# Patient Record
Sex: Female | Born: 1985
Health system: Southern US, Community
[De-identification: ages and names within clinical notes are randomized; demographics above are authoritative.]

## PROBLEM LIST (undated history)

## (undated) DIAGNOSIS — E88819 Insulin resistance, unspecified: Secondary | ICD-10-CM

## (undated) DIAGNOSIS — E669 Obesity, unspecified: Secondary | ICD-10-CM

## (undated) DIAGNOSIS — N979 Female infertility, unspecified: Secondary | ICD-10-CM

## (undated) DIAGNOSIS — D649 Anemia, unspecified: Secondary | ICD-10-CM

## (undated) DIAGNOSIS — R87610 Atypical squamous cells of undetermined significance on cytologic smear of cervix (ASC-US): Secondary | ICD-10-CM

## (undated) DIAGNOSIS — E8881 Metabolic syndrome: Secondary | ICD-10-CM

## (undated) DIAGNOSIS — R87612 Low grade squamous intraepithelial lesion on cytologic smear of cervix (LGSIL): Secondary | ICD-10-CM

## (undated) HISTORY — DX: Metabolic syndrome: E88.81

## (undated) HISTORY — DX: Insulin resistance, unspecified: E88.819

## (undated) HISTORY — DX: Low grade squamous intraepithelial lesion on cytologic smear of cervix (LGSIL): R87.612

## (undated) HISTORY — DX: Atypical squamous cells of undetermined significance on cytologic smear of cervix (ASC-US): R87.610

## (undated) HISTORY — DX: Female infertility, unspecified: N97.9

## (undated) HISTORY — DX: Obesity, unspecified: E66.9

---

## 2008-01-27 HISTORY — PX: COLPOSCOPY: SHX161

## 2008-08-16 DIAGNOSIS — R87612 Low grade squamous intraepithelial lesion on cytologic smear of cervix (LGSIL): Secondary | ICD-10-CM

## 2008-08-16 HISTORY — DX: Low grade squamous intraepithelial lesion on cytologic smear of cervix (LGSIL): R87.612

## 2008-09-27 ENCOUNTER — Ambulatory Visit: Payer: Self-pay

## 2008-10-04 ENCOUNTER — Ambulatory Visit: Payer: Self-pay

## 2008-10-04 HISTORY — PX: DILATION AND CURETTAGE OF UTERUS: SHX78

## 2008-10-04 HISTORY — PX: LEEP: SHX91

## 2011-07-25 ENCOUNTER — Inpatient Hospital Stay: Payer: Self-pay

## 2011-07-25 LAB — CBC WITH DIFFERENTIAL/PLATELET
Basophil #: 0 10*3/uL (ref 0.0–0.1)
Basophil %: 0.4 %
HCT: 28.4 % — ABNORMAL LOW (ref 35.0–47.0)
Lymphocyte %: 25.2 %
MCH: 29.6 pg (ref 26.0–34.0)
MCV: 92 fL (ref 80–100)
Monocyte #: 0.7 x10 3/mm (ref 0.2–0.9)
Neutrophil #: 6.4 10*3/uL (ref 1.4–6.5)
RDW: 15.9 % — ABNORMAL HIGH (ref 11.5–14.5)

## 2011-07-27 LAB — HEMATOCRIT: HCT: 24.8 % — ABNORMAL LOW (ref 35.0–47.0)

## 2011-10-26 ENCOUNTER — Emergency Department: Payer: Self-pay | Admitting: Emergency Medicine

## 2011-10-26 LAB — URINALYSIS, COMPLETE
Bilirubin,UR: NEGATIVE
Blood: NEGATIVE
Glucose,UR: NEGATIVE mg/dL
Leukocyte Esterase: NEGATIVE
Nitrite: NEGATIVE
Ph: 5
Protein: 30
RBC,UR: 3 /HPF
Specific Gravity: 1.031
Squamous Epithelial: 19
WBC UR: 2 /HPF

## 2011-10-26 LAB — COMPREHENSIVE METABOLIC PANEL
Albumin: 3.5 g/dL (ref 3.4–5.0)
Alkaline Phosphatase: 92 U/L (ref 50–136)
BUN: 15 mg/dL (ref 7–18)
Calcium, Total: 9.2 mg/dL (ref 8.5–10.1)
Chloride: 107 mmol/L (ref 98–107)
Co2: 26 mmol/L (ref 21–32)
Creatinine: 1.32 mg/dL — ABNORMAL HIGH (ref 0.60–1.30)
EGFR (African American): >60
EGFR (Non-African Amer.): 56 — ABNORMAL LOW
Glucose: 119 mg/dL — ABNORMAL HIGH (ref 65–99)
Osmolality: 287 (ref 275–301)
Potassium: 4 mmol/L (ref 3.5–5.1)
SGPT (ALT): 43 U/L (ref 12–78)

## 2011-10-26 LAB — CBC
HCT: 33.4 % — ABNORMAL LOW
HGB: 11.6 g/dL — ABNORMAL LOW
MCH: 29.1 pg
MCHC: 34.7 g/dL
MCV: 84 fL
Platelet: 225 10*3/uL
RBC: 3.99 X10 6/mm 3
RDW: 15.1 % — ABNORMAL HIGH
WBC: 10.4 10*3/uL

## 2011-10-26 LAB — LIPASE, BLOOD: Lipase: 117 U/L

## 2011-11-09 ENCOUNTER — Ambulatory Visit: Payer: Self-pay | Admitting: Surgery

## 2011-11-18 ENCOUNTER — Ambulatory Visit: Payer: Self-pay | Admitting: Surgery

## 2011-11-18 HISTORY — PX: CHOLECYSTECTOMY, LAPAROSCOPIC: SHX56

## 2011-11-18 LAB — CBC WITH DIFFERENTIAL/PLATELET
Basophil %: 0.2 %
Eosinophil #: 0.1 10*3/uL (ref 0.0–0.7)
Eosinophil %: 1 %
Lymphocyte #: 2.2 10*3/uL (ref 1.0–3.6)
Lymphocyte %: 32 %
Monocyte #: 0.5 x10 3/mm (ref 0.2–0.9)
Neutrophil #: 4 10*3/uL (ref 1.4–6.5)
Neutrophil %: 59.5 %
WBC: 6.8 10*3/uL (ref 3.6–11.0)

## 2011-11-18 LAB — HEPATIC FUNCTION PANEL A (ARMC)
Albumin: 3.4 g/dL (ref 3.4–5.0)
Alkaline Phosphatase: 110 U/L (ref 50–136)
Bilirubin, Direct: <0.1 mg/dL (ref 0.00–0.20)
Bilirubin,Total: 0.1 mg/dL — ABNORMAL LOW (ref 0.2–1.0)
Total Protein: 7.2 g/dL (ref 6.4–8.2)

## 2012-01-06 DIAGNOSIS — R87610 Atypical squamous cells of undetermined significance on cytologic smear of cervix (ASC-US): Secondary | ICD-10-CM

## 2012-01-06 HISTORY — DX: Atypical squamous cells of undetermined significance on cytologic smear of cervix (ASC-US): R87.610

## 2014-01-26 NOTE — L&D Delivery Note (Signed)
Delivery Note At 9:32 AM a viable female was delivered via Vaginal, Spontaneous Delivery (Presentation: Right Occiput Transverse).  APGAR: 8, 9; weight  .   Placenta status: Intact, Spontaneous.  Cord: 3 vessels with the following complications: .  Cord pH: N/A  Anesthesia: None  Episiotomy: None Lacerations: 1st degree Suture Repair: 2.0 vicryl Est. Blood Loss (mL): 400  Mom to postpartum.  Baby to Couplet care / Skin to Skin.  Quick second stage. Infant to maternal abdomen for delayed cord clamping, cut by FOB after pulsation stopped. Infant to skin-to-skin with mother. Bleeding initially brisk, resolved with Pitocin bolus only.   Baby's Name: Andrea Jordan 10/13/2014, 10:10 AM

## 2014-02-05 LAB — OB RESULTS CONSOLE VARICELLA ZOSTER ANTIBODY, IGG: Varicella: IMMUNE

## 2014-02-05 LAB — OB RESULTS CONSOLE RUBELLA ANTIBODY, IGM: RUBELLA: NON-IMMUNE/NOT IMMUNE

## 2014-02-05 LAB — OB RESULTS CONSOLE HEPATITIS B SURFACE ANTIGEN: Hepatitis B Surface Ag: NEGATIVE

## 2014-02-05 LAB — OB RESULTS CONSOLE ABO/RH: RH Type: POSITIVE

## 2014-02-05 LAB — OB RESULTS CONSOLE GC/CHLAMYDIA
Chlamydia: NEGATIVE
GC PROBE AMP, GENITAL: NEGATIVE

## 2014-02-05 LAB — OB RESULTS CONSOLE HIV ANTIBODY (ROUTINE TESTING): HIV: NONREACTIVE

## 2014-05-02 ENCOUNTER — Ambulatory Visit
Admit: 2014-05-02 | Disposition: A | Discharge status: Home / Self Care | Payer: Self-pay | Attending: Obstetrics and Gynecology | Admitting: Obstetrics and Gynecology

## 2014-05-15 NOTE — Op Note (Signed)
PATIENT NAME:  Andrea Jordan, Andrea Jordan MR#:  [id number] DATE OF BIRTH:  [birth year]  DATE OF PROCEDURE:  11/18/2011  PREOPERATIVE DIAGNOSIS: Symptomatic cholelithiasis.   POSTOPERATIVE DIAGNOSIS: Symptomatic cholecystitis.   PROCEDURE PERFORMED: Laparoscopic cholecystectomy   SURGEON: Nolah Krenzer A. Marina Gravel, MD   ANESTHESIA: General endotracheal.   FINDINGS: Small stones.   DESCRIPTION OF PROCEDURE: With the patient in the supine position, general oral endotracheal anesthesia was induced. The patient's abdomen was widely prepped and draped with ChloraPrep solution. Time-out was observed.   A 12 mm blunt Hassan trocar was placed through an open technique through an infraumbilical skin incision. Stay sutures were passed through the fascia. Pneumoperitoneum was established. The patient was then positioned in reverse Trendelenburg and airplane right side up. A 5 mm Bladeless trocar was placed in the epigastric region. Two 5-mm first assistant ports were placed in the right subcostal margin. The gallbladder was identified in the right upper quadrant, grasped along the fundus and elevated towards the right shoulder. Lateral traction was placed in Hartmann's pouch. Hepatoduodenal ligament was then incised with blunt technique liberating cystic duct. Single cystic artery was identified as well. [facility] cholangiogram catheter clamp was then placed across the base of the gallbladder. An attempt at cholangiography was performed, however, I could not obtain an adequate seal. As such, this was abandoned. As the patient had a previously normal appearing MRCP scan, I did not persist with cholangiography. Cystic duct was small caliber. With a critical view of safety cholecystectomy being identified, the cystic duct was doubly clipped on the portal side, singly clipped on the gallbladder side, and divided. Immediately adjacent a cystic artery was identified and this was doubly clipped on the portal side, singly clipped on  the gallbladder side, and divided. The gallbladder was then retrieved off the gallbladder fossa utilizing hook cautery apparatus, placed into an EndoCatch device, and retrieved. The right upper quadrant was irrigated with a total of 500 mL of normal saline, aspirated dry, and there was no evidence of bowel injury or bile leak. Ports were thus removed under direct visualization. The infraumbilical fascial defect was closed utilizing a figure-of-eight vertically oriented #0 Vicryl suture. A total of 30 mL of 0.25% plain Marcaine was infiltrated along all skin and fascial incisions. 4-0 Vicryl subcuticular was applied to all skin edges followed by benzoin, Steri-Strips, and occlusive sterile dressings.   The patient was then subsequently extubated and taken to the recovery room in stable and satisfactory condition.   ____________________________ Jeannette How Marina Gravel, MD mab:drc D: 11/18/2011 17:58:04 ET T: 11/19/2011 08:49:10 ET JOB#: [id number]  cc: Elta Guadeloupe A. Marina Gravel, MD, <Dictator> Wonda Cheng. Laurey Morale, MD Hortencia Conradi MD ELECTRONICALLY SIGNED 11/23/2011 7:04

## 2014-06-05 NOTE — H&P (Signed)
L&D Evaluation:  History:   HPI 29 year old G1 P0 with EDC=08/10/2011 by 8wk 3day ultrasound presents at 47 5/7 weeks with c/o SROM clear fluid at 1800 tonight. Has had some mild contractions since then. No bleeding. Baby active. Prenatal care remarkable for conception on Clomid and metformin (insulin resistence/PCOS), Hx of LEEP for LGSIL, anemia, obesity (BMI=48), and positive GBS. LABS: O POS, Rubell NI, VI, GBS positive. One  hr GTT elevated, but 3 hr GTT WNL.    Presents with SROM at 1800    Patient's Medical History Obesity, PCOS, insulin resistence.    Patient's Surgical History D&C  LEEP  both on 10/04/2008    Medications Pre Natal Vitamins  Metformin 1000 mgm BID, ASA 81 mgm daily.    Allergies NKDA, clams    Social History none    Family History Sister with septal closure defect   ROS:   ROS see HPI   Exam:   Vital Signs stable  124/58, Temp=98.9    General no apparent distress    Mental Status clear    Chest clear    Heart normal sinus rhythm, no murmur/gallop/rubs    Abdomen gravid, non-tender    Estimated Fetal Weight 8#    Fetal Position cephalic on ultrasound    Edema trace edema LE    Reflexes 2+    Pelvic no external lesions, 1/100%/ per RN exam    Mebranes Ruptured, gross ROM. +Nitrazine    Description clear    FHT Baby very active-it looks like baseline 140s with prolonged accels. moderate variability    Ucx irregular    Skin dry   Impression:   Impression IUP at 37 5/7 weeks with SROM. +GBS   Plan:   Plan EFM/NST, monitor contractions and for cervical change, antibiotics for GBBS prophylaxis, IF not actively laboring by 12 midnight will augment labor.   Electronic Signatures: Karene Fry (CNM)  (Signed 29-Jun-13 22:51)  Authored: L&D Evaluation   Last Updated: 29-Jun-13 22:51 by Karene Fry (CNM)

## 2014-09-13 LAB — OB RESULTS CONSOLE GBS: STREP GROUP B AG: NEGATIVE

## 2014-10-12 ENCOUNTER — Inpatient Hospital Stay
Admission: EM | Admit: 2014-10-12 | Discharge: 2014-10-15 | DRG: 775 | Disposition: A | Discharge status: Home / Self Care | Payer: BLUE CROSS/BLUE SHIELD | Attending: Obstetrics & Gynecology | Admitting: Obstetrics & Gynecology

## 2014-10-12 DIAGNOSIS — Z9889 Other specified postprocedural states: Secondary | ICD-10-CM

## 2014-10-12 DIAGNOSIS — O4103X0 Oligohydramnios, third trimester, not applicable or unspecified: Secondary | ICD-10-CM | POA: Diagnosis present

## 2014-10-12 DIAGNOSIS — Z3A40 40 weeks gestation of pregnancy: Secondary | ICD-10-CM | POA: Diagnosis present

## 2014-10-12 DIAGNOSIS — O9902 Anemia complicating childbirth: Secondary | ICD-10-CM | POA: Diagnosis present

## 2014-10-12 DIAGNOSIS — Z6841 Body Mass Index (BMI) 40.0 and over, adult: Secondary | ICD-10-CM

## 2014-10-12 DIAGNOSIS — O99214 Obesity complicating childbirth: Secondary | ICD-10-CM | POA: Diagnosis present

## 2014-10-12 HISTORY — DX: Anemia, unspecified: D64.9

## 2014-10-12 LAB — TYPE AND SCREEN
ABO/RH(D): O POS
Antibody Screen: NEGATIVE

## 2014-10-12 LAB — CBC
HEMATOCRIT: 31 % — AB (ref 35.0–47.0)
Hemoglobin: 10.2 g/dL — ABNORMAL LOW (ref 12.0–16.0)
MCH: 29.7 pg (ref 26.0–34.0)
MCHC: 32.8 g/dL (ref 32.0–36.0)
MCV: 90.3 fL (ref 80.0–100.0)
Platelets: 173 10*3/uL (ref 150–440)
RBC: 3.44 MIL/uL — ABNORMAL LOW (ref 3.80–5.20)
RDW: 15.6 % — AB (ref 11.5–14.5)
WBC: 9 10*3/uL (ref 3.6–11.0)

## 2014-10-12 LAB — ABO/RH: ABO/RH(D): O POS

## 2014-10-12 MED ORDER — LACTATED RINGERS IV SOLN: 500.0000 mL | INTRAVENOUS | Status: DC | PRN

## 2014-10-12 MED ORDER — OXYTOCIN 40 UNITS IN LACTATED RINGERS INFUSION - SIMPLE MED
62.5000 mL/h | INTRAVENOUS | Status: DC
  Administered 2014-10-13: 999 mL/h via INTRAVENOUS
  Filled 2014-10-12: qty 1000

## 2014-10-12 MED ORDER — OXYTOCIN BOLUS FROM INFUSION: 500.0000 mL | INTRAVENOUS | Status: DC

## 2014-10-12 MED ORDER — LACTATED RINGERS IV SOLN
INTRAVENOUS | Status: DC
  Administered 2014-10-12 – 2014-10-13 (×2): via INTRAVENOUS

## 2014-10-12 MED ORDER — OXYTOCIN 40 UNITS IN LACTATED RINGERS INFUSION - SIMPLE MED
1.0000 m[IU]/min | INTRAVENOUS | Status: DC
  Administered 2014-10-12: 1 m[IU]/min via INTRAVENOUS

## 2014-10-12 MED ORDER — TERBUTALINE SULFATE 1 MG/ML IJ SOLN: 0.2500 mg | Freq: Once | INTRAMUSCULAR | Status: DC | PRN

## 2014-10-12 NOTE — H&P (Signed)
OB History & Physical   History of Present Illness:  Chief Complaint:   HPI:  Andrea Jordan is a 29 y.o. G2P1001 female at [redacted]w[redacted]d dated by LMP c/w 1st trimester [place] with EDC of 10/06/14. Presented to L&D with complaints of no fetal movement since this AM.  Upon presentation she began to feel movement again, and it normalized.  However, AFI was performed and there were no pockets of fluid that were without umbilical cord, and scant at that.  This was confirmed via color doppler on ultrasound.    +FM, no CTX, no LOF, no VB  Pregnancy Issues: 1. Morbid obesity: BMI 49 (on admission BMI is calculated as 54) 2. History of "tissue dystocia" and PPH 3. History of LGA infant (8#10oz @ 37w) 4. History of LEEP, before G1 5. Family history of septal defect, declined fetal echo 6. Rubella non-immune 7. Anemia   Maternal Medical History:  No past medical history on file.  No past surgical history on file.  Allergies  Allergen Reactions  . Clams [Shellfish Allergy] Hives    Prior to Admission medications   Medication Sig Start Date End Date Taking? Authorizing Jakub Debold  ferrous sulfate 325 (65 FE) MG tablet Take 325 mg by mouth daily with breakfast.   Yes Historical Cadarius Nevares, MD  Prenatal Vit-Fe Fumarate-FA (PRENATAL MULTIVITAMIN) TABS tablet Take 1 tablet by mouth daily at 12 noon.   Yes Historical Lucah Petta, MD     Prenatal care site: [facility]   Social History: 0x3     Family History: family history is not on file.   Review of Systems: Negative x 10 systems reviewed except as noted in the HPI.     Physical Exam:  Vital Signs: BP 113/64 mmHg  Pulse 72  Temp(Src) 98.1 F (36.7 C) (Oral)  Resp 19  Ht $R'5\' 3"'sg$  (1.6 m)  Wt 138.347 kg (305 lb)  BMI 54.04 kg/m2 General: no acute distress.  HEENT: normocephalic, atraumatic Heart: regular rate & rhythm.  No murmurs/rubs/gallops Lungs: clear to auscultation bilaterally, normal respiratory effort Abdomen: soft, gravid,  non-tender;  EFW: 8.5lbs Pelvic:   External: Normal external female genitalia  Cervix: Dilation: 3 / Effacement (%): 80 / Station: -2    Extremities: non-tender, symmetric, 1+ edema bilaterally.  DTRs: 1+ Neurologic: Alert & oriented x 3.    Pertinent Results:  Prenatal Labs: Blood type/Rh O+   Antibody screen neg  Rubella Varicella non-immune immune  RPR NR  HBsAg neg  HIV NR  GC neg  Chlamydia neg  Genetic screening declined  1 hour GTT Early 109, 28w 134  3 hour GTT   GBS negative   FHT: 135 mod + accels no decels TOCO: quiet SVE: 3/80/-2, membranes intact SSE: not performed  Bedside Ultrasound:  Number of Fetus: one  Presentation: cephalic  Fluid: scant, not measurable  Placental Location: not visible on screen; per records posterior  Assessment:  Andrea Jordan is a 29 y.o. G43P1001 female at [redacted]w[redacted]d with DFM and oligohydramnios.   Plan:  1. Admit to Labor & Delivery  2. CBC, T&S, Clrs, IVF 3. GBS neg.   4. Consents obtained. 5. Will start with pitocin due to bishop score of >8, and primarily as contraction stress test.  We discussed possibility of amnioinfusion if the baby's head is low enough for artificial rupture of membranes.  IUPC also may be necessary for contraction monitoring due to body habitus.   6. Category 1 strip, continuous FM/toco 7. Methergine, Hemabate, cytotec,  oxytocin at bedside at time of delivery 8. MMR post partum  Ward, Chelsea C  10/12/2014 9:38 PM

## 2014-10-13 MED ORDER — SIMETHICONE 80 MG PO CHEW: 80.0000 mg | CHEWABLE_TABLET | ORAL | Status: DC | PRN

## 2014-10-13 MED ORDER — OXYCODONE-ACETAMINOPHEN 5-325 MG PO TABS: 1.0000 | ORAL_TABLET | ORAL | Status: DC | PRN

## 2014-10-13 MED ORDER — LACTATED RINGERS IV SOLN: INTRAVENOUS | Status: DC

## 2014-10-13 MED ORDER — BUTORPHANOL TARTRATE 1 MG/ML IJ SOLN
INTRAMUSCULAR | Status: AC
  Administered 2014-10-13: 2 mg via INTRAVENOUS
  Filled 2014-10-13: qty 2

## 2014-10-13 MED ORDER — BUTORPHANOL TARTRATE 1 MG/ML IJ SOLN
2.0000 mg | Freq: Once | INTRAMUSCULAR | Status: AC
Start: 2014-10-13 — End: 2014-10-13
  Administered 2014-10-13: 2 mg via INTRAVENOUS

## 2014-10-13 MED ORDER — LIDOCAINE HCL (PF) 1 % IJ SOLN
INTRAMUSCULAR | Status: AC
  Administered 2014-10-13: 30 mL
  Filled 2014-10-13: qty 30

## 2014-10-13 MED ORDER — PRENATAL MULTIVITAMIN CH
1.0000 | ORAL_TABLET | Freq: Every day | ORAL | Status: DC
  Administered 2014-10-13 – 2014-10-15 (×3): 1 via ORAL
  Filled 2014-10-13 (×3): qty 1

## 2014-10-13 MED ORDER — AMMONIA AROMATIC IN INHA
RESPIRATORY_TRACT | Status: AC
  Filled 2014-10-13: qty 10

## 2014-10-13 MED ORDER — ZOLPIDEM TARTRATE 5 MG PO TABS: 5.0000 mg | ORAL_TABLET | Freq: Every evening | ORAL | Status: DC | PRN

## 2014-10-13 MED ORDER — IBUPROFEN 600 MG PO TABS
600.0000 mg | ORAL_TABLET | Freq: Four times a day (QID) | ORAL | Status: DC
  Administered 2014-10-13 – 2014-10-14 (×5): 600 mg via ORAL
  Filled 2014-10-13 (×5): qty 1

## 2014-10-13 MED ORDER — FERROUS SULFATE 325 (65 FE) MG PO TABS
325.0000 mg | ORAL_TABLET | Freq: Every day | ORAL | Status: DC
  Administered 2014-10-14 – 2014-10-15 (×2): 325 mg via ORAL
  Filled 2014-10-13 (×2): qty 1

## 2014-10-13 MED ORDER — WITCH HAZEL-GLYCERIN EX PADS: 1.0000 "application " | MEDICATED_PAD | CUTANEOUS | Status: DC | PRN

## 2014-10-13 MED ORDER — MISOPROSTOL 200 MCG PO TABS
ORAL_TABLET | ORAL | Status: AC
  Filled 2014-10-13: qty 4

## 2014-10-13 MED ORDER — LANOLIN HYDROUS EX OINT: TOPICAL_OINTMENT | CUTANEOUS | Status: DC | PRN

## 2014-10-13 MED ORDER — ONDANSETRON HCL 4 MG/2ML IJ SOLN: 4.0000 mg | INTRAMUSCULAR | Status: DC | PRN

## 2014-10-13 MED ORDER — IBUPROFEN 600 MG PO TABS
600.0000 mg | ORAL_TABLET | Freq: Four times a day (QID) | ORAL | Status: DC
  Administered 2014-10-13: 600 mg via ORAL
  Filled 2014-10-13 (×2): qty 1

## 2014-10-13 MED ORDER — MAGNESIUM HYDROXIDE 400 MG/5ML PO SUSP: 30.0000 mL | ORAL | Status: DC | PRN

## 2014-10-13 MED ORDER — DIPHENHYDRAMINE HCL 25 MG PO CAPS: 25.0000 mg | ORAL_CAPSULE | Freq: Four times a day (QID) | ORAL | Status: DC | PRN

## 2014-10-13 MED ORDER — DIBUCAINE 1 % RE OINT: 1.0000 "application " | TOPICAL_OINTMENT | RECTAL | Status: DC | PRN

## 2014-10-13 MED ORDER — DOCUSATE SODIUM 100 MG PO CAPS
100.0000 mg | ORAL_CAPSULE | Freq: Two times a day (BID) | ORAL | Status: DC
  Administered 2014-10-13 – 2014-10-15 (×4): 100 mg via ORAL
  Filled 2014-10-13 (×4): qty 1

## 2014-10-13 MED ORDER — BENZOCAINE-MENTHOL 20-0.5 % EX AERO
1.0000 | INHALATION_SPRAY | CUTANEOUS | Status: DC | PRN
Start: 2014-10-13 — End: 2014-10-15
  Filled 2014-10-13: qty 56

## 2014-10-13 MED ORDER — ACETAMINOPHEN 325 MG PO TABS
650.0000 mg | ORAL_TABLET | ORAL | Status: DC | PRN
Start: 2014-10-13 — End: 2014-10-15

## 2014-10-13 MED ORDER — OXYTOCIN 10 UNIT/ML IJ SOLN
INTRAMUSCULAR | Status: AC
  Filled 2014-10-13: qty 2

## 2014-10-13 MED ORDER — ONDANSETRON HCL 4 MG PO TABS
4.0000 mg | ORAL_TABLET | ORAL | Status: DC | PRN
Start: 2014-10-13 — End: 2014-10-15

## 2014-10-14 LAB — RPR: RPR Ser Ql: NONREACTIVE

## 2014-10-14 LAB — HEMOGLOBIN AND HEMATOCRIT, BLOOD
HEMATOCRIT: 28.2 % — AB (ref 35.0–47.0)
Hemoglobin: 9.1 g/dL — ABNORMAL LOW (ref 12.0–16.0)

## 2014-10-14 MED ORDER — INFLUENZA VAC SPLIT QUAD 0.5 ML IM SUSY
0.5000 mL | PREFILLED_SYRINGE | INTRAMUSCULAR | Status: AC
  Administered 2014-10-15: 0.5 mL via INTRAMUSCULAR
  Filled 2014-10-14: qty 0.5

## 2014-10-14 NOTE — Discharge Instructions (Signed)
Discharge instructions:  ° °Call office if you have any of the following: headache, visual changes, fever >100 F, chills, breast concerns, excessive vaginal bleeding, incision drainage or problems, leg pain or redness, depression or any other concerns.  ° °Activity: Do not lift > 10 lbs for 6 weeks.  °No intercourse or tampons for 6 weeks.  °No driving for 1-2 weeks.  ° °

## 2014-10-14 NOTE — Discharge Summary (Signed)
Obstetric Discharge Summary Reason for Admission: induction of labor for anhydramnios Delivery Type: spontaneous vaginal delivery Postpartum Procedures: none Complications-Intrapartum or Postpartum: none    Recent Labs  10/12/14 2042 10/14/14 0606  HGB 10.2* 9.1*  HCT 31.0* 28.2*      Gestational Age at Delivery: [redacted]w[redacted]d  Antepartum complications: oligohydramnios and Obesity Date of Delivery: 10/13/14  Delivered By: T. Brothers, CNM Delivery Type: spontaneous vaginal delivery  Physical Exam:  General: alert and cooperative Lochia: appropriate Uterine Fundus: firm Incision: N/A DVT Evaluation: No evidence of DVT seen on physical exam. Abdomen: abdomen is soft without significant tenderness, masses, organomegaly or guarding  Prenatal Labs Blood Type: O+ Rubella: Non-immune - MMR given Varicella: Immune TDAP: Given during pregnancy  Flu - given at discharge Contraception: undecided - possibly considering Nexplanon  Discharge Diagnoses: Term Pregnancy-delivered  Discharge Information:  Activity: pelvic rest Diet: routine Medications: PNV, Ibuprofen and Iron Condition: stable Instructions:  Discharge instructions:   Call office if you have any of the following: headache, visual changes, fever >100 F, chills, breast concerns, excessive vaginal bleeding, incision drainage or problems, leg pain or redness, depression or any other concerns.   Activity: Do not lift > 10 lbs for 6 weeks.  No intercourse or tampons for 6 weeks.  No driving for 1-2 weeks.   Discharge to: home Follow-up Information    Follow up with [facility]. Schedule an appointment as soon as possible for a visit in 6 weeks.   Why:  postpartum check with any provider      Newborn Data: Live born female  Birth Weight: 8 lb 5.9 oz (3795 g) APGAR: 8, 9 Breastfeeding  Home with mother.  Andrea Jordan, [place] 10/14/2014, 8:51 AM

## 2014-10-15 MED ORDER — MEASLES, MUMPS & RUBELLA VAC ~~LOC~~ INJ
0.5000 mL | INJECTION | Freq: Once | SUBCUTANEOUS | Status: AC
  Administered 2014-10-15: 0.5 mL via SUBCUTANEOUS
  Filled 2014-10-15: qty 0.5

## 2014-10-15 NOTE — Discharge Summary (Signed)
Andrea Jordan, CNM Certified Nurse Midwife Cos[facility] Needed Obstetrics Discharge Summaries 10/14/2014 8:51 AM    Expand All Collapse All   Obstetric Discharge Summary Reason for Admission: induction of labor for anhydramnios Delivery Type: spontaneous vaginal delivery Postpartum Procedures: none Complications-Intrapartum or Postpartum: none    Recent Labs  10/12/14 2042 10/14/14 0606  HGB 10.2* 9.1*  HCT 31.0* 28.2*      Gestational Age at Delivery: [redacted]w[redacted]d  Antepartum complications: oligohydramnios and Obesity Date of Delivery: 10/13/14  Delivered By: T. Brothers, CNM Delivery Type: spontaneous vaginal delivery  Physical Exam:  General: alert and cooperative Lochia: appropriate Uterine Fundus: firm at U/ML/NT per RN exam  DVT Evaluation: No evidence of DVT seen on physical exam. Abdomen: abdomen is soft without significant tenderness, masses, organomegaly or guarding  Prenatal Labs Blood Type: O+ Rubella: Non-immune - MMR given Varicella: Immune TDAP: Given during pregnancy  Flu - given at discharge Contraception: undecided - possibly considering Nexplanon  Discharge Diagnoses: Term Pregnancy-delivered  Discharge Information: Date: 10/15/2014 Activity: pelvic rest Diet: routine Medications: PNV, Ibuprofen and Iron Condition: stable Instructions:  Discharge instructions:   Call office if you have any of the following: headache, visual changes, fever >100 F, chills, breast concerns, excessive vaginal bleeding, incision drainage or problems, leg pain or redness, depression or any other concerns.   Activity: Do not lift > 10 lbs for 6 weeks.  No intercourse or tampons for 6 weeks.  No driving for 1-2 weeks.   Discharge to: home Follow-up Information    Follow up with [facility]. Schedule an appointment as soon as possible for a visit in 6 weeks.   Why: postpartum check with any provider      Newborn Data: Live born female   Birth Weight: 8 lb 5.9 oz (3795 g) APGAR: 8, 9 Breastfeeding  Home with mother.  Jordan, Andrea, CNM 10/15/2014      Revision History     Date/Time User Provider Type Action   10/15/2014 9:50 AM Andrea Jordan, CNM Certified Nurse Midwife Sign   10/14/2014 8:58 AM Tamara Brothers, CNM Certified Nurse Midwife Sign   10/14/2014 8:55 AM Tamara Brothers, CNM Certified Nurse Midwife Sign   View Details Report              

## 2014-10-15 NOTE — Progress Notes (Signed)
Pt discharged to home with newborn.  Discharge instructions reviewed with patient and husband who verb u/o.

## 2015-04-19 IMAGING — US US OB LIMITED
2 series · 14 of 28 positions shown · non-contrast
Comparison: none

CLINICAL DATA: Check cervix length

EXAM:
LIMITED OBSTETRIC ULTRASOUND

[Series 1: us ob limited · 0.26mm/px · 13 of 28 slices shown (1 of 2)]
[im 2/28]
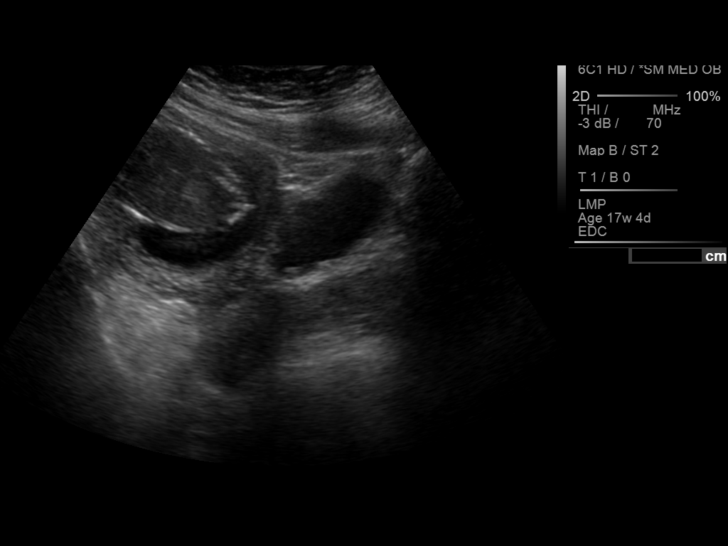
[im 4/28]
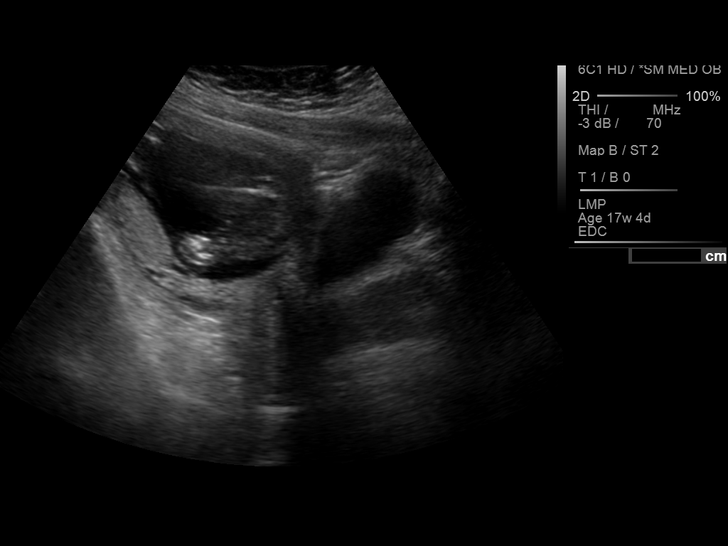
[im 6/28]
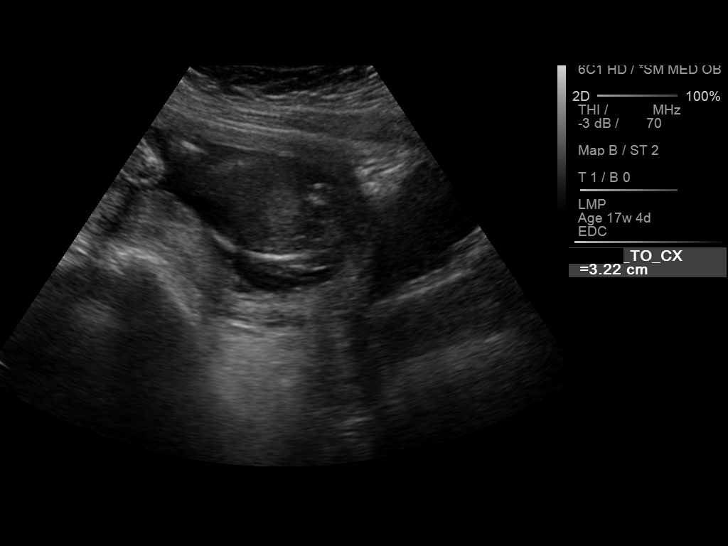
[im 8/28]
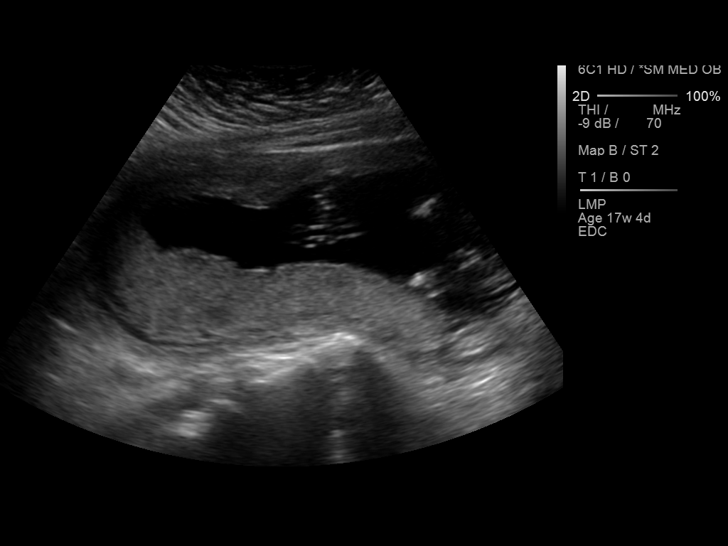
[im 10/28]
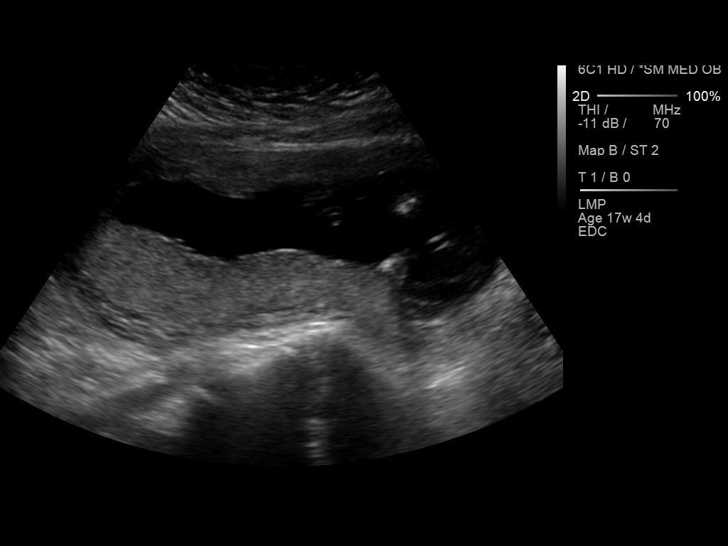
[im 12/28]
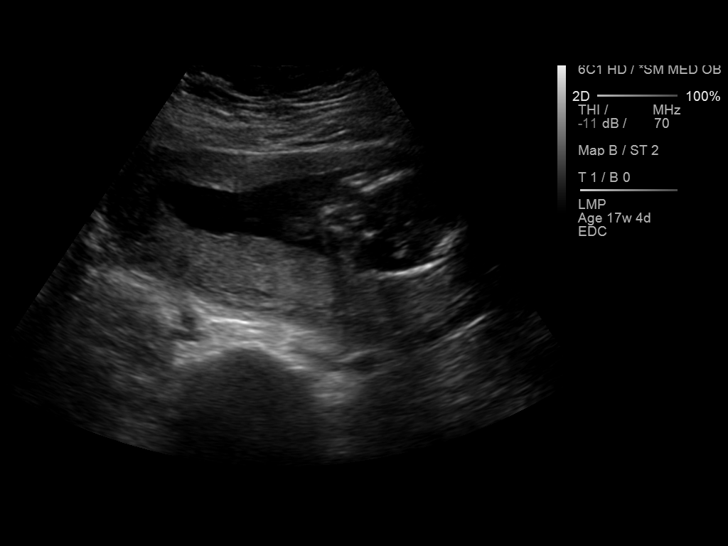
[im 14/28]
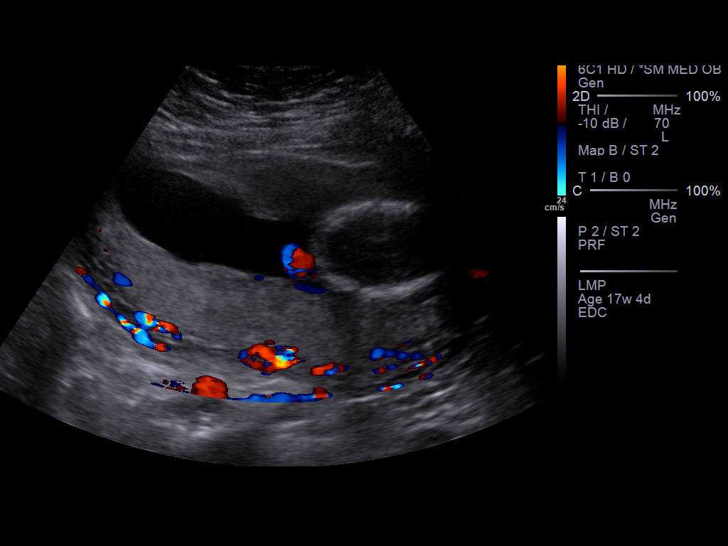
[im 16/28]
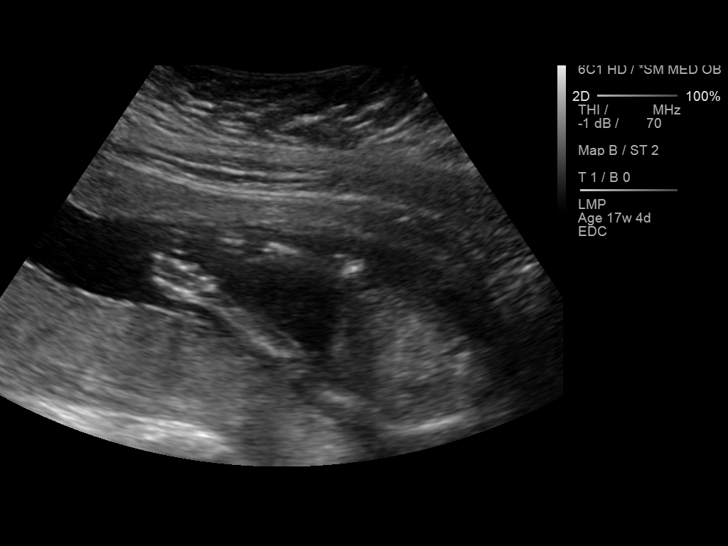
[im 18/28]
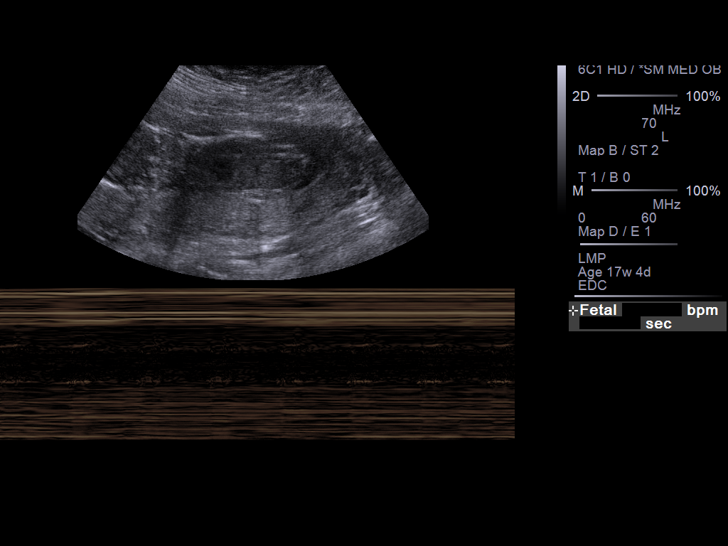
[im 20/28]
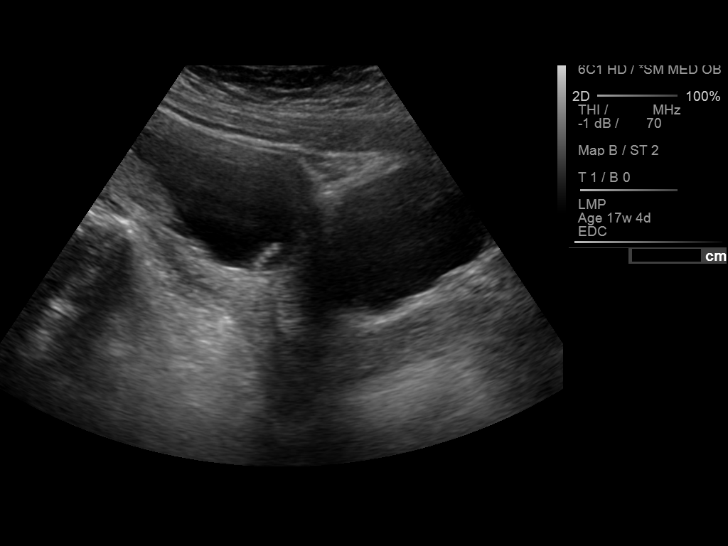
[im 22/28]
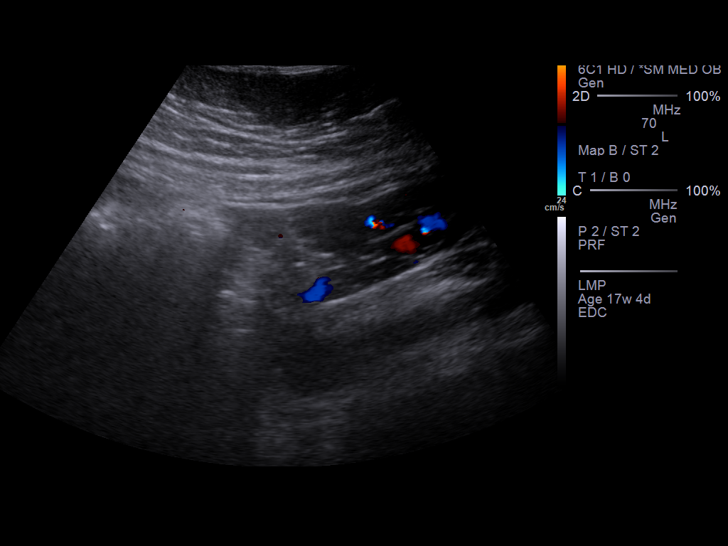
[im 24/28]
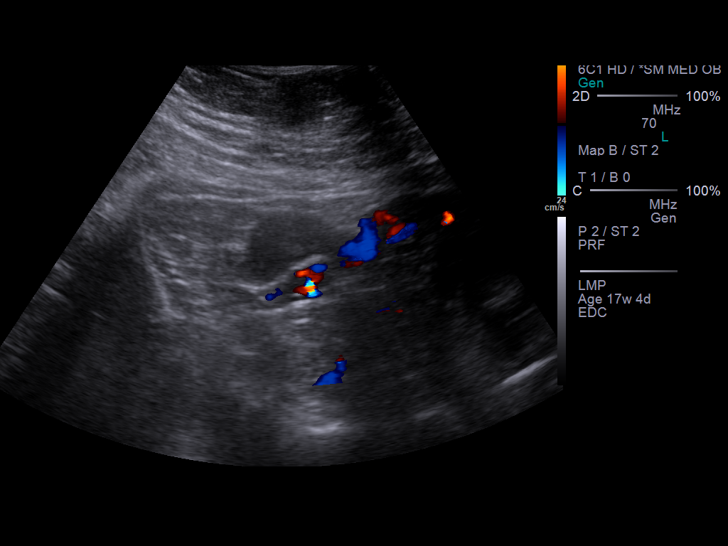
[im 26/28]
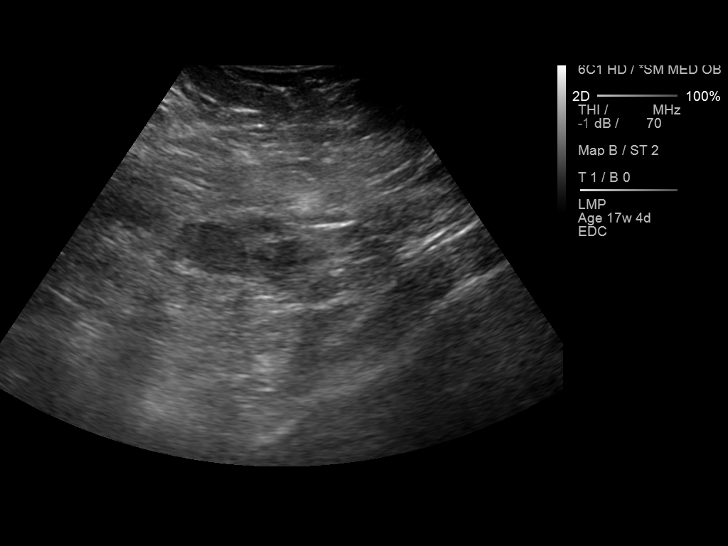

[Series 3: us ob limited · 0.26mm/px · 1 of 1 slices shown (2 of 2)]
[im 1/1]
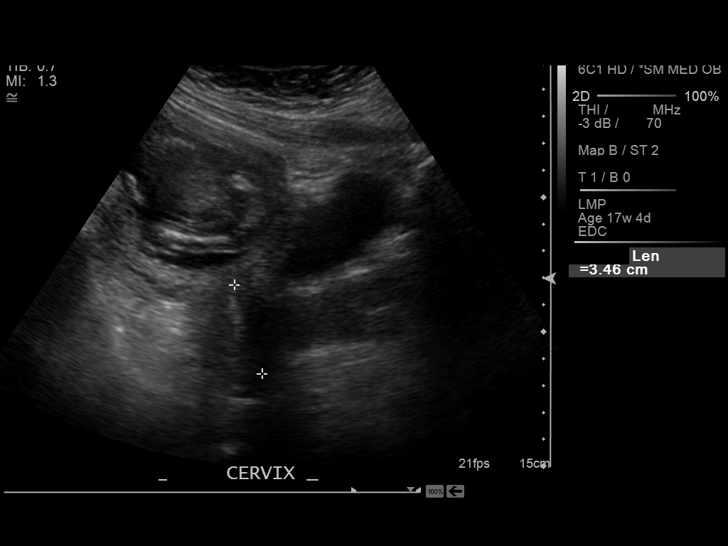

[14 of 28 positions shown; findings below may reference images not displayed]

FINDINGS: Number of Fetuses: 1

Heart Rate:  149 bpm

Movement: Yes

Presentation: Breech

Placental Location: Posterior

Previa: No

Amniotic Fluid (Subjective):  Posterior

FL:  2.54cm 17w  5d

MATERNAL FINDINGS:

Cervix:  Closed

Cervix length equals 3.5 cm.

Uterus/Adnexae:  No abnormality visualized.
IMPRESSION: 1. Single living intrauterine gestation. The estimated gestational
age is 17 weeks and 5 days.
2. Closed cervix measuring 3.5 cm.

This exam is performed on an emergent basis and does not
comprehensively evaluate fetal size, dating, or anatomy; follow-up
complete OB US should be considered if further fetal assessment is
warranted.

## 2015-06-17 DIAGNOSIS — T7840XA Allergy, unspecified, initial encounter: Secondary | ICD-10-CM | POA: Diagnosis not present

## 2015-06-17 DIAGNOSIS — L298 Other pruritus: Secondary | ICD-10-CM | POA: Diagnosis not present

## 2016-03-24 ENCOUNTER — Ambulatory Visit: Payer: Self-pay | Admitting: Obstetrics and Gynecology

## 2016-04-13 ENCOUNTER — Encounter: Payer: Self-pay | Admitting: Obstetrics and Gynecology

## 2016-04-13 ENCOUNTER — Ambulatory Visit (INDEPENDENT_AMBULATORY_CARE_PROVIDER_SITE_OTHER): Payer: BLUE CROSS/BLUE SHIELD | Admitting: Obstetrics and Gynecology

## 2016-04-13 VITALS — BP 124/74 | Ht 63.0 in | Wt 273.0 lb

## 2016-04-13 DIAGNOSIS — Z1151 Encounter for screening for human papillomavirus (HPV): Secondary | ICD-10-CM

## 2016-04-13 DIAGNOSIS — Z30431 Encounter for routine checking of intrauterine contraceptive device: Secondary | ICD-10-CM | POA: Diagnosis not present

## 2016-04-13 DIAGNOSIS — Z124 Encounter for screening for malignant neoplasm of cervix: Secondary | ICD-10-CM

## 2016-04-13 DIAGNOSIS — Z01419 Encounter for gynecological examination (general) (routine) without abnormal findings: Secondary | ICD-10-CM | POA: Diagnosis not present

## 2016-04-13 NOTE — Progress Notes (Signed)
HPI:      Andrea Jordan is a 31 y.o. [id number] who LMP was Andrea Jordan was 03/23/2016., presents today for her annual examination.  Her menses are regular every 28-30 days, lasting 3 days.  Dysmenorrhea mild, uses Tylenol. She does not have intermenstrual bleeding.  Sex activity: single partner, contraception - IUD. Liletta placed 11/21/14 PP. Last Pap: February 05, 2014  Results were: no abnormalities  Hx of STDs: HPV on pap  There is no FH of breast cancer. There is no FH of ovarian cancer. The patient does do self-breast exams.  Tobacco use: The patient denies current or previous tobacco use. Alcohol use: none Exercise: moderately active. Doing Get Phit diet/exercise plan for wt loss. Has lost 21# since 1/18.  She does get adequate calcium and Vitamin D in her diet.   Past Medical History:  Diagnosis Date  . Anemia   . Infertility, female   . Insulin resistance   . LGSIL on Pap smear of cervix 08/16/2008   lgsil; pos  . Obesity   . Papanicolaou smear of cervix with atypical squamous cells of undetermined significance (ASC-US) 01/06/2012   [place]; neg hpv    Past Surgical History:  Procedure Laterality Date  . CHOLECYSTECTOMY, LAPAROSCOPIC  11/18/2011   DR. BYRD  . COLPOSCOPY  2010   CIN I  . DILATION AND CURETTAGE OF UTERUS  10/04/2008  . LEEP  10/04/2008   cervical; PJR    Family History  Problem Relation Age of Onset  . Brain cancer Mother   . Colon polyps Father   . Diabetes Father   . Heart disease Father   . Hypertension Father   . Colon polyps Sister   . Endometriosis Sister   . Diabetes Paternal Uncle   . Heart disease Paternal Uncle   . Diabetes Maternal Grandmother   . Diabetes Paternal Grandmother   . Diabetes Paternal Grandfather      ROS:  Review of Systems  Constitutional: Negative for fever, malaise/fatigue and weight loss.  HENT: Negative for congestion, ear pain and sinus pain.   Respiratory: Negative  for cough, shortness of breath and wheezing.   Cardiovascular: Negative for chest pain, orthopnea and leg swelling.  Gastrointestinal: Negative for constipation, diarrhea, nausea and vomiting.  Genitourinary: Negative for dysuria, frequency, hematuria and urgency.       Breast ROS: negative   Musculoskeletal: Negative for back pain, joint pain and myalgias.  Skin: Negative for itching and rash.  Neurological: Negative for dizziness, tingling, focal weakness and headaches.  Endo/Heme/Allergies: Negative for environmental allergies. Does not bruise/bleed easily.  Psychiatric/Behavioral: Negative for depression and suicidal ideas. The patient is not nervous/anxious and does not have insomnia.     Objective: BP 124/74   Ht 5\' 3"  (1.6 m)   Wt 273 lb (123.8 kg)   LMP 03/23/2016   BMI 48.36 kg/m    Physical Exam  Constitutional: She is oriented to person, place, and time. She appears well-developed and well-nourished.  Genitourinary: Vagina normal and uterus normal. No erythema or tenderness in the vagina. No vaginal discharge found. Right adnexum does not display mass and does not display tenderness. Left adnexum does not display mass and does not display tenderness.  Cervix exhibits visible IUD strings. Cervix does not exhibit motion tenderness or polyp. Uterus is not enlarged or tender.  Neck: Normal range of motion. No thyromegaly present.  Cardiovascular: Normal rate, regular rhythm and normal heart sounds.   No murmur  heard. Pulmonary/Chest: Effort normal and breath sounds normal. Right breast exhibits no mass, no nipple discharge, no skin change and no tenderness. Left breast exhibits no mass, no nipple discharge, no skin change and no tenderness.  Abdominal: Soft. There is no tenderness. There is no guarding.  Musculoskeletal: Normal range of motion.  Neurological: She is alert and oriented to person, place, and time. No cranial nerve deficit.  Psychiatric: She has a normal mood and  affect. Her behavior is normal.  Vitals reviewed.    Assessment/Plan: Encounter for annual routine gynecological examination  Cervical cancer screening - Plan: IGP, Aptima HPV  Screening for HPV (human papillomavirus) - Plan: IGP, Aptima HPV  Encounter for routine checking of intrauterine contraceptive device (IUD) - IUD in place. Due for rem 10/2018.     Meds ordered this encounter  Medications  . levonorgestrel (LILETTA, 52 MG,) 18.6 MCG/DAY IUD IUD    Sig: 1 each by Intrauterine route once.               GYN counsel breast self exam, adequate intake of calcium and vitamin D, diet and exercise     F/U  Return in about 1 year (around 04/13/2017).  Aasiyah Auerbach B. Oma Marzan, PA-C 04/13/2016 2:53 PM

## 2016-04-16 LAB — IGP, APTIMA HPV
HPV APTIMA: NEGATIVE
PAP SMEAR COMMENT: 0

## 2016-04-20 ENCOUNTER — Encounter: Payer: Self-pay | Admitting: Family Medicine

## 2016-04-20 ENCOUNTER — Ambulatory Visit (INDEPENDENT_AMBULATORY_CARE_PROVIDER_SITE_OTHER): Payer: BLUE CROSS/BLUE SHIELD | Admitting: Family Medicine

## 2016-04-20 VITALS — BP 106/66 | HR 73 | Temp 97.8°F | Resp 16 | Ht 64.0 in | Wt 274.2 lb

## 2016-04-20 DIAGNOSIS — Z13220 Encounter for screening for lipoid disorders: Secondary | ICD-10-CM

## 2016-04-20 DIAGNOSIS — Z131 Encounter for screening for diabetes mellitus: Secondary | ICD-10-CM | POA: Diagnosis not present

## 2016-04-20 DIAGNOSIS — R1011 Right upper quadrant pain: Secondary | ICD-10-CM

## 2016-04-20 DIAGNOSIS — E282 Polycystic ovarian syndrome: Secondary | ICD-10-CM

## 2016-04-20 DIAGNOSIS — Z9049 Acquired absence of other specified parts of digestive tract: Secondary | ICD-10-CM | POA: Diagnosis not present

## 2016-04-20 DIAGNOSIS — R5383 Other fatigue: Secondary | ICD-10-CM

## 2016-04-20 NOTE — Progress Notes (Signed)
Name: Andrea Jordan   MRN: [id number]    DOB: Oct 01, 1985   Date:04/20/2016       Progress Note  Subjective  Chief Complaint  Chief Complaint  Patient presents with  . Establish Care  . Abdominal Pain    Onset- end of January, She has pain where her gallbladder was, when she starts breathing heavy she will feel like something is pushing on that area.     HPI  RUQ pain: she noticed RUQ pain since the end of January , pain is described as intermittent, usually within 30 minutes after eating, but also during activity, pain is sharp and is in waves, not associated with nausea or vomiting. She has not noticed a change in bowel movements. She had a cholecystomy was in 2013.   PCOS: she has tried Metformin in the past, to get pregnant, but she did not like taking it. Caused nausea  Fatigue: she has two children under 38 yo and feels tired, but able to go to work and the gym. Denies sadness.   Obesity: she has lost 28 lbs since 09/2015 by joining a gym - [employer] - and did two weight loss challenges, and is doing very well. She is currently on a keto diet since January.   Patient Active Problem List   Diagnosis Date Noted  . Obesity, Class III, BMI 40-49.9 (morbid obesity) ([place]) 04/20/2016  . PCOS (polycystic ovarian syndrome) 04/20/2016    Past Surgical History:  Procedure Laterality Date  . CHOLECYSTECTOMY, LAPAROSCOPIC  11/18/2011   DR. BYRD  . COLPOSCOPY  2010   CIN I  . DILATION AND CURETTAGE OF UTERUS  10/04/2008  . LEEP  10/04/2008   cervical; PJR    Family History  Problem Relation Age of Onset  . Brain cancer Mother   . Colon polyps Father   . Diabetes Father   . Heart disease Father   . Hypertension Father   . Colon polyps Sister   . Endometriosis Sister   . Diabetes Paternal Uncle   . Heart disease Paternal Uncle   . Stroke Paternal Uncle   . Diabetes Maternal Grandmother   . Diabetes Paternal Grandmother   . Diabetes Paternal Grandfather     Social  History   Social History  . Marital status: Married    Spouse name: Corene Cornea  . Number of children: 2  . Years of education: 50yr college   Occupational History  . Technical brewer    Social History Main Topics  . Smoking status: Never Smoker  . Smokeless tobacco: Never Used  . Alcohol use Yes     Comment: rarely  . Drug use: No  . Sexual activity: Yes    Partners: Male    Birth control/ protection: None, IUD     Comment: Liletta   Other Topics Concern  . Not on file   Social History Narrative   Married, husband had a stroke at age 7 but is back to normal   They have two girls.   Works as a Estate agent at [employer]     Current Outpatient Prescriptions:  .  levonorgestrel (LILETTA, 52 MG,) 18.6 MCG/DAY IUD IUD, 1 each by Intrauterine route once. 10/2014, Disp: , Rfl:   Allergies  Allergen Reactions  . Clams [Shellfish Allergy] Hives     ROS  Constitutional: Negative for fever, positive for  weight change.  Respiratory: Negative for cough and shortness of breath.  Cardiovascular: Negative for chest pain or palpitations.  Gastrointestinal: Negative for abdominal pain, no bowel changes.  Musculoskeletal: Negative for gait problem or joint swelling.  Skin: Negative for rash.  Neurological: Negative for dizziness or headache.  No other specific complaints in a complete review of systems (except as listed in HPI above).  Objective  Vitals:   04/20/16 1538  BP: 106/66  Pulse: 73  Resp: 16  Temp: 97.8 F (36.6 C)  TempSrc: Oral  SpO2: 98%  Weight: 274 lb 3.2 oz (124.4 kg)  Height: 5\' 4"  (1.626 m)    Body mass index is 47.07 kg/m.  Physical Exam  Constitutional: Patient appears well-developed Obese  No distress.  HEENT: head atraumatic, normocephalic, pupils equal and reactive to light,  neck supple, throat within normal limits Cardiovascular: Normal rate, regular rhythm and normal heart sounds.  No murmur heard. No BLE  edema. Pulmonary/Chest: Effort normal and breath sounds normal. No respiratory distress. Abdominal: Soft.  There is no tenderness. Psychiatric: Patient has a normal mood and affect. behavior is normal. Judgment and thought content normal.   Recent Results (from the past 2160 hour(s))  IGP, Aptima HPV     Status: None   Collection Time: 04/13/16  3:04 PM  Result Value Ref Range   DIAGNOSIS: Comment     Comment: NEGATIVE FOR INTRAEPITHELIAL LESION AND MALIGNANCY.   Specimen adequacy: Comment     Comment: Satisfactory for evaluation. Endocervical and/or squamous metaplastic cells (endocervical component) are present.    CLINICIAN PROVIDED ICD10: Comment     Comment: Z12.4 Z11.51    Performed by: Comment     Comment: Orlinda Blalock, Cytotechnologist (ASCP)   PAP SMEAR COMMENT .    Note: Comment     Comment: The Pap smear is a screening test designed to aid in the detection of premalignant and malignant conditions of the uterine cervix.  It is not a diagnostic procedure and should not be used as the sole means of detecting cervical cancer.  Both false-positive and false-negative reports do occur.    Test Methodology Comment     Comment: This liquid based ThinPrep(R) pap test was screened with the use of an image guided system.    HPV Aptima Negative Negative    Comment: This test detects fourteen high-risk HPV types (16/18/31/33/35/39/45/ 51/52/56/58/59/66/68) without differentiation.       PHQ2/9: Depression screen PHQ 2/9 04/20/2016  Decreased Interest 0  Down, Depressed, Hopeless 0  PHQ - 2 Score 0     Fall Risk: Fall Risk  04/20/2016  Falls in the past year? No     Functional Status Survey: Is the patient deaf or have difficulty hearing?: No Does the patient have difficulty seeing, even when wearing glasses/contacts?: No Does the patient have difficulty concentrating, remembering, or making decisions?: No Does the patient have difficulty walking or climbing  stairs?: No Does the patient have difficulty dressing or bathing?: No Does the patient have difficulty doing errands alone such as visiting a doctor's office or shopping?: No    Assessment & Plan  1. RUQ pain  - COMPLETE METABOLIC PANEL WITH GFR - CBC with Differential/Platelet - US Abdomen Limited RUQ; Future  2. Obesity, Class III, BMI 40-49.9 (morbid obesity) ([place])  She is doing weight loss challenges at the gym [employer] ) started first one in 09/2015 initial weight 294 lbs - lost 7 lbs in 9 weeks,  and a second one 01/2016 and lost 21 lbs in 9 weeks. On a  Keto diet.  Discussed GLP-1 and she will research about it. Discussed possible side effects, no family history of thyroid cancer - Hemoglobin A1c - Insulin, fasting  3. Diabetes mellitus screening  - Hemoglobin A1c  4. PCOS (polycystic ovarian syndrome)   - Hemoglobin A1c - Insulin, fasting  5. History of cholecystectomy   6. Other fatigue  - COMPLETE METABOLIC PANEL WITH GFR - CBC with Differential/Platelet - TSH - Vitamin B12 - VITAMIN D 25 Hydroxy (Vit-D Deficiency, Fractures)  7. Lipid screening  - Lipid panel

## 2016-04-20 NOTE — Patient Instructions (Signed)
GLP-1 agonist Ozempic Trulicity Victoza

## 2016-04-21 LAB — CBC WITH DIFFERENTIAL/PLATELET
BASOS PCT: 0 %
Basophils Absolute: 0 cells/uL (ref 0–200)
EOS ABS: 50 {cells}/uL (ref 15–500)
Eosinophils Relative: 1 %
HCT: 39.2 % (ref 35.0–45.0)
Hemoglobin: 12.7 g/dL (ref 11.7–15.5)
LYMPHS ABS: 2250 {cells}/uL (ref 850–3900)
LYMPHS PCT: 45 %
MCH: 28.3 pg (ref 27.0–33.0)
MCHC: 32.4 g/dL (ref 32.0–36.0)
MCV: 87.5 fL (ref 80.0–100.0)
MONO ABS: 400 {cells}/uL (ref 200–950)
MPV: 11 fL (ref 7.5–12.5)
Monocytes Relative: 8 %
Neutro Abs: 2300 cells/uL (ref 1500–7800)
Neutrophils Relative %: 46 %
PLATELETS: 231 10*3/uL (ref 140–400)
RBC: 4.48 MIL/uL (ref 3.80–5.10)
RDW: 14.7 % (ref 11.0–15.0)
WBC: 5 10*3/uL (ref 3.8–10.8)

## 2016-04-22 LAB — COMPLETE METABOLIC PANEL WITH GFR
ALT: 15 U/L (ref 6–29)
AST: 14 U/L (ref 10–30)
Albumin: 3.9 g/dL (ref 3.6–5.1)
Alkaline Phosphatase: 77 U/L (ref 33–115)
BUN: 17 mg/dL (ref 7–25)
CALCIUM: 9.1 mg/dL (ref 8.6–10.2)
CHLORIDE: 106 mmol/L (ref 98–110)
CO2: 28 mmol/L (ref 20–31)
CREATININE: 1.05 mg/dL (ref 0.50–1.10)
GFR, EST AFRICAN AMERICAN: 82 mL/min (ref >=60)
GFR, Est Non African American: 71 mL/min (ref >=60)
Glucose, Bld: 107 mg/dL — ABNORMAL HIGH (ref 65–99)
POTASSIUM: 5.1 mmol/L (ref 3.5–5.3)
Sodium: 137 mmol/L (ref 135–146)
Total Bilirubin: 0.3 mg/dL (ref 0.2–1.2)
Total Protein: 6.7 g/dL (ref 6.1–8.1)

## 2016-04-22 LAB — HEMOGLOBIN A1C
HEMOGLOBIN A1C: 4.9 % (ref <5.7)
MEAN PLASMA GLUCOSE: 94 mg/dL

## 2016-04-22 LAB — LIPID PANEL
CHOL/HDL RATIO: 4.3 ratio (ref <5.0)
CHOLESTEROL: 155 mg/dL (ref <200)
HDL: 36 mg/dL — ABNORMAL LOW (ref >50)
LDL Cholesterol: 108 mg/dL — ABNORMAL HIGH (ref <100)
Triglycerides: 57 mg/dL (ref <150)
VLDL: 11 mg/dL (ref <30)

## 2016-04-22 LAB — VITAMIN B12: VITAMIN B 12: 259 pg/mL (ref 200–1100)

## 2016-04-22 LAB — TSH: TSH: 1.51 m[IU]/L

## 2016-04-22 LAB — VITAMIN D 25 HYDROXY (VIT D DEFICIENCY, FRACTURES): VIT D 25 HYDROXY: 24 ng/mL — AB (ref 30–100)

## 2016-04-22 LAB — INSULIN, FASTING: INSULIN FASTING, SERUM: 19.6 u[IU]/mL (ref 2.0–19.6)

## 2016-04-23 ENCOUNTER — Other Ambulatory Visit: Payer: Self-pay | Admitting: Family Medicine

## 2016-04-23 MED ORDER — SEMAGLUTIDE(0.25 OR 0.5MG/DOS) 2 MG/1.5ML ~~LOC~~ SOPN: 0.2500 mg | PEN_INJECTOR | SUBCUTANEOUS | 0 refills | Status: DC

## 2016-04-24 ENCOUNTER — Ambulatory Visit
Admission: RE | Admit: 2016-04-24 | Discharge: 2016-04-24 | Disposition: A | Discharge status: Home / Self Care | Payer: BLUE CROSS/BLUE SHIELD | Source: Ambulatory Visit | Attending: Family Medicine | Admitting: Family Medicine

## 2016-04-24 DIAGNOSIS — R1011 Right upper quadrant pain: Secondary | ICD-10-CM | POA: Insufficient documentation

## 2016-04-24 DIAGNOSIS — K760 Fatty (change of) liver, not elsewhere classified: Secondary | ICD-10-CM | POA: Insufficient documentation

## 2016-04-28 ENCOUNTER — Ambulatory Visit: Payer: BLUE CROSS/BLUE SHIELD

## 2016-05-04 ENCOUNTER — Encounter: Payer: Self-pay | Admitting: Family Medicine

## 2016-05-04 DIAGNOSIS — K760 Fatty (change of) liver, not elsewhere classified: Secondary | ICD-10-CM | POA: Insufficient documentation

## 2016-07-21 ENCOUNTER — Ambulatory Visit: Payer: BLUE CROSS/BLUE SHIELD | Admitting: Family Medicine

## 2016-07-28 ENCOUNTER — Encounter: Payer: Self-pay | Admitting: Family Medicine

## 2016-07-28 ENCOUNTER — Ambulatory Visit (INDEPENDENT_AMBULATORY_CARE_PROVIDER_SITE_OTHER): Payer: BLUE CROSS/BLUE SHIELD | Admitting: Family Medicine

## 2016-07-28 VITALS — BP 110/68 | HR 88 | Temp 98.0°F | Resp 18 | Ht 64.0 in | Wt 265.0 lb

## 2016-07-28 DIAGNOSIS — E559 Vitamin D deficiency, unspecified: Secondary | ICD-10-CM | POA: Diagnosis not present

## 2016-07-28 DIAGNOSIS — K760 Fatty (change of) liver, not elsewhere classified: Secondary | ICD-10-CM | POA: Diagnosis not present

## 2016-07-28 DIAGNOSIS — D229 Melanocytic nevi, unspecified: Secondary | ICD-10-CM | POA: Diagnosis not present

## 2016-07-28 DIAGNOSIS — E8881 Metabolic syndrome: Secondary | ICD-10-CM

## 2016-07-28 DIAGNOSIS — E88819 Insulin resistance, unspecified: Secondary | ICD-10-CM

## 2016-07-28 DIAGNOSIS — E66813 Obesity, class 3: Secondary | ICD-10-CM

## 2016-07-28 DIAGNOSIS — E785 Hyperlipidemia, unspecified: Secondary | ICD-10-CM | POA: Insufficient documentation

## 2016-07-28 MED ORDER — SEMAGLUTIDE (1 MG/DOSE) 2 MG/1.5ML ~~LOC~~ SOPN: 1.0000 mg | PEN_INJECTOR | SUBCUTANEOUS | 2 refills | Status: DC

## 2016-07-28 NOTE — Progress Notes (Addendum)
Name: Andrea Jordan   MRN: [id number]    DOB: [birth year]   Date:07/28/2016       Progress Note  Subjective  Chief Complaint  Chief Complaint  Patient presents with  . Obesity    3 month follow up    HPI  RUQ pain: she noticed RUQ pain since the end of January 2018 pain is described as intermittent, usually within 30 minutes after eating, but also during activity, pain is sharp and is in waves, not associated with nausea or vomiting. She has not noticed a change in bowel movements. She had a cholecystomy was in 2013.  Liver US, showed fatty liver, symptoms improved with change in diet, only one episode since last visit and was on her way back for a beach trip.   PCOS: she has tried Metformin in the past, to get pregnant, but she did not like taking it. Caused nausea  Fatigue: she has two children under 80 yo and feels tired, but able to go to work and the gym. Denies sadness.   Obesity: start weight was 294 lbs back in 09/2015  she had lost 28 lbs 6 months, after joining a gym [employer] social [employer] . She is also on Ozempic since the end of March and has lost another 9 lbs, still exercising, also noticed appetite has decreased with medication, we will recheck labs on her next visit  Dyslipidemia: low HDL and high LDL she is on a healthy , balanced diet  Vitamin D deficiency: taking otc supplementation   Patient Active Problem List   Diagnosis Date Noted  . Fatty liver 05/04/2016  . Obesity, Class III, BMI 40-49.9 (morbid obesity) ([place]) 04/20/2016  . PCOS (polycystic ovarian syndrome) 04/20/2016    Past Surgical History:  Procedure Laterality Date  . CHOLECYSTECTOMY, LAPAROSCOPIC  11/18/2011   DR. BYRD  . COLPOSCOPY  2010   CIN I  . DILATION AND CURETTAGE OF UTERUS  10/04/2008  . LEEP  10/04/2008   cervical; PJR    Family History  Problem Relation Age of Onset  . Brain cancer Mother   . Colon polyps Father   . Diabetes Father   . Heart disease Father   . Hypertension  Father   . Colon polyps Sister   . Endometriosis Sister   . Diabetes Paternal Uncle   . Heart disease Paternal Uncle   . Stroke Paternal Uncle   . Diabetes Maternal Grandmother   . Diabetes Paternal Grandmother   . Diabetes Paternal Grandfather     Social History   Social History  . Marital status: Married    Spouse name: Corene Cornea  . Number of children: 2  . Years of education: 44yr college   Occupational History  . Technical brewer    Social History Main Topics  . Smoking status: Never Smoker  . Smokeless tobacco: Never Used  . Alcohol use Yes     Comment: rarely  . Drug use: No  . Sexual activity: Yes    Partners: Male    Birth control/ protection: None, IUD     Comment: Liletta   Other Topics Concern  . Not on file   Social History Narrative   Married, husband had a stroke at age 66 but is back to normal   They have two girls.   Works as a Estate agent at [employer]     Current Outpatient Prescriptions:  .  levonorgestrel (LILETTA, 52 MG,) 18.6 MCG/DAY  IUD IUD, 1 each by Intrauterine route once. 10/2014, Disp: , Rfl:  .  Semaglutide (OZEMPIC) 1 MG/DOSE SOPN, Inject 1 mg into the skin once a week., Disp: 2 pen, Rfl: 2  Allergies  Allergen Reactions  . Clams [Shellfish Allergy] Hives     ROS  Constitutional: Negative for fever, positive for weight change.  Respiratory: Negative for cough and shortness of breath.   Cardiovascular: Negative for chest pain or palpitations.  Gastrointestinal: Negative for abdominal pain, no bowel changes.  Musculoskeletal: Negative for gait problem or joint swelling.  Skin: Negative for rash. Multiple moles Neurological: Negative for dizziness or headache.  No other specific complaints in a complete review of systems (except as listed in HPI above).  Objective  Vitals:   07/28/16 0842  BP: 110/68  Pulse: 88  Resp: 18  Temp: 98 F (36.7 C)  SpO2: 97%  Weight: 265 lb (120.2 kg)  Height: 5\' 4"   (1.626 m)    Body mass index is 45.49 kg/m.  Physical Exam  Constitutional: Patient appears well-developed and well-nourished. Obese  No distress.  HEENT: head atraumatic, normocephalic, pupils equal and reactive to light,  neck supple, throat within normal limits Cardiovascular: Normal rate, regular rhythm and normal heart sounds.  No murmur heard. No BLE edema. Pulmonary/Chest: Effort normal and breath sounds normal. No respiratory distress. Abdominal: Soft.  There is no tenderness. Psychiatric: Patient has a normal mood and affect. behavior is normal. Judgment and thought content normal. Skin: multiple moles  PHQ2/9: Depression screen [facility] 2/9 07/28/2016 04/20/2016  Decreased Interest 0 0  Down, Depressed, Hopeless 0 0  PHQ - 2 Score 0 0     Fall Risk: Fall Risk  07/28/2016 04/20/2016  Falls in the past year? No No    Functional Status Survey: Is the patient deaf or have difficulty hearing?: No Does the patient have difficulty seeing, even when wearing glasses/contacts?: No Does the patient have difficulty concentrating, remembering, or making decisions?: No Does the patient have difficulty walking or climbing stairs?: No Does the patient have difficulty dressing or bathing?: No Does the patient have difficulty doing errands alone such as visiting a doctor's office or shopping?: No    Assessment & Plan  1. Obesity, Class III, BMI 40-49.9 (morbid obesity) ([place])  Discussed with the patient the risk posed by an increased BMI. Discussed importance of portion control, calorie counting and at least 150 minutes of physical activity weekly. Avoid sweet beverages and drink more water. Eat at least 6 servings of fruit and vegetables daily  - Semaglutide (OZEMPIC) 1 MG/DOSE SOPN; Inject 1 mg into the skin once a week.  Dispense: 2 pen; Refill: 2  2. Insulin resistance  - Semaglutide (OZEMPIC) 1 MG/DOSE SOPN; Inject 1 mg into the skin once a week.  Dispense: 2 pen; Refill: 2  3.  Fatty liver  She states she has been doing better with change in diet, no longer having pain, except while at beach last week, and was eating out and eating more junk.   4. Vitamin D deficiency   5. Dyslipidemia  On life style modification   6. Numerous moles  - Ambulatory referral to Dermatology

## 2016-07-28 NOTE — Addendum Note (Signed)
Addended by: Steele Sizer F on: 07/28/2016 09:36 AM   Modules accepted: Orders

## 2016-10-06 ENCOUNTER — Ambulatory Visit (INDEPENDENT_AMBULATORY_CARE_PROVIDER_SITE_OTHER): Payer: BLUE CROSS/BLUE SHIELD | Admitting: Family Medicine

## 2016-10-06 ENCOUNTER — Encounter: Payer: Self-pay | Admitting: Family Medicine

## 2016-10-06 VITALS — BP 120/80 | HR 79 | Temp 97.8°F | Resp 16 | Ht 64.0 in | Wt 266.0 lb

## 2016-10-06 DIAGNOSIS — I809 Phlebitis and thrombophlebitis of unspecified site: Secondary | ICD-10-CM | POA: Diagnosis not present

## 2016-10-06 MED ORDER — MELOXICAM 15 MG PO TABS: 15.0000 mg | ORAL_TABLET | Freq: Every day | ORAL | 0 refills | Status: DC

## 2016-10-06 NOTE — Progress Notes (Addendum)
Name: Andrea Jordan   MRN: [id number]    DOB: 1985/04/02   Date:10/06/2016       Progress Note  Subjective  Chief Complaint  Chief Complaint  Patient presents with  . Rash    spot  that is painful on right leg, noticed on yesterday    HPI  PT presents with new onset RIGHT anterior-lateral lower leg that began yesteday - area is located where patient has previously present varicose veins.  Area is extremely sensitive to touch.  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 No chest pain or shortness of breath, no posterior calf pain.  Patient Active Problem List   Diagnosis Date Noted  . Vitamin D deficiency 07/28/2016  . Dyslipidemia 07/28/2016  . Insulin resistance 07/28/2016  . Numerous moles 07/28/2016  . Fatty liver 05/04/2016  . Obesity, Class III, BMI 40-49.9 (morbid obesity) ([place]) 04/20/2016  . PCOS (polycystic ovarian syndrome) 04/20/2016    Social History  Substance Use Topics  . Smoking status: Never Smoker  . Smokeless tobacco: Never Used  . Alcohol use Yes     Comment: rarely     Current Outpatient Prescriptions:  .  levonorgestrel (LILETTA, 52 MG,) 18.6 MCG/DAY IUD IUD, 1 each by Intrauterine route once. 10/2014, Disp: , Rfl:  .  Semaglutide (OZEMPIC) 1 MG/DOSE SOPN, Inject 1 mg into the skin once a week., Disp: 2 pen, Rfl: 2  Allergies  Allergen Reactions  . Clams [Shellfish Allergy] Hives    ROS  Ten systems reviewed and is negative except as mentioned in HPI  Objective  Vitals:   10/06/16 1023  BP: 120/80  Pulse: 79  Resp: 16  Temp: 97.8 F (36.6 C)  TempSrc: Oral  SpO2: 99%  Weight: 266 lb (120.7 kg)  Height: 5\' 4"  (1.626 m)   Body mass index is 45.66 kg/m.  Nursing Note and Vital Signs reviewed.  Physical Exam  Constitutional: Patient appears well-developed and well-nourished. Obese No distress.  HEENT: head atraumatic, normocephalic Cardiovascular: Normal  rate, regular rhythm, S1/S2 present.  No murmur or rub heard. No BLE edema. Pulmonary/Chest: Effort normal and breath sounds clear. No respiratory distress or retractions. Psychiatric: Patient has a normal mood and affect. behavior is normal. Judgment and thought content normal. MSK: RIGHT anterior-lateral lower leg has exquisite tenderness on very light palpation along superficial varicose veins - no palpable cord, no erythema or swelling. No posterior calf pain on palpation - no calf erythema or heat. Normal popliteal fossa, skin to RLE is warm and dry.  No results found for this or any previous visit (from the past 2160 hour(s)).  Assessment & Plan  1. Phlebitis - meloxicam (MOBIC) 15 MG tablet; Take 1 tablet (15 mg total) by mouth daily.  Dispense: 15 tablet; Refill: 0 - Ice as needed; advised may call for Vein and Vascular referral if she chooses for spider vein removal. -Red flags and when to present for emergency care or RTC including fever >101.42F, chest pain, shortness of breath, calf redness/heat/tenderness, new/worsening/un-resolving symptoms, reviewed with patient at time of visit. Follow up and care instructions discussed and provided in AVS.   I have reviewed this encounter including the documentation in this note and/or discussed this patient with the Johney Maine, FNP, NP-C. I am certifying that I agree with the content of this note as supervising physician.  Steele Sizer, MD [facility] 10/10/2016, 8:55  PM

## 2016-10-06 NOTE — Patient Instructions (Signed)
Phlebitis Phlebitis is soreness and puffiness (swelling) in a vein. Follow these instructions at home:  Only take medicine as told by your doctor.  Raise (elevate) the affected limb on a pillow as told by your doctor.  Keep a warm pack on the affected vein as told by your doctor. Do not sleep with a heating pad.  Use special stockings or bandages around the area of the affected vein as told by your doctor. These will speed healing and keep the condition from coming back.  Talk to your doctor about all the medicines you take.  Get follow-up blood tests as told by your doctor.  If the phlebitis is in your legs: ? Avoid standing or resting for long periods. ? Keep your legs moving. Raise your legs when you sit or lie.  Do not smoke.  Follow-up with your doctor as told. Contact a doctor if:  You have strange bruises or bleeding.  Your puffiness or pain in the affected area is not getting better.  You are taking medicine to lessen puffiness (anti-inflammatory medicine), and you get belly pain.  You have a fever. Get help right away if:  The phlebitis gets worse and you have more pain, puffiness (swelling), or redness.  You have trouble breathing or have chest pain. This information is not intended to replace advice given to you by your health care provider. Make sure you discuss any questions you have with your health care provider. Document Released: 12/31/2008 Document Revised: 06/20/2015 Document Reviewed: 09/19/2012 Elsevier Interactive Patient Education  2017 Elsevier Inc.  

## 2016-10-29 ENCOUNTER — Ambulatory Visit (INDEPENDENT_AMBULATORY_CARE_PROVIDER_SITE_OTHER): Payer: BLUE CROSS/BLUE SHIELD | Admitting: Family Medicine

## 2016-10-29 ENCOUNTER — Encounter: Payer: Self-pay | Admitting: Family Medicine

## 2016-10-29 VITALS — BP 100/56 | HR 97 | Resp 14 | Ht 63.0 in | Wt 257.0 lb

## 2016-10-29 DIAGNOSIS — K760 Fatty (change of) liver, not elsewhere classified: Secondary | ICD-10-CM

## 2016-10-29 DIAGNOSIS — E8881 Metabolic syndrome: Secondary | ICD-10-CM | POA: Diagnosis not present

## 2016-10-29 DIAGNOSIS — E66813 Obesity, class 3: Secondary | ICD-10-CM

## 2016-10-29 DIAGNOSIS — E785 Hyperlipidemia, unspecified: Secondary | ICD-10-CM

## 2016-10-29 DIAGNOSIS — N63 Unspecified lump in unspecified breast: Secondary | ICD-10-CM

## 2016-10-29 DIAGNOSIS — E559 Vitamin D deficiency, unspecified: Secondary | ICD-10-CM | POA: Diagnosis not present

## 2016-10-29 DIAGNOSIS — R5383 Other fatigue: Secondary | ICD-10-CM | POA: Diagnosis not present

## 2016-10-29 DIAGNOSIS — Z23 Encounter for immunization: Secondary | ICD-10-CM | POA: Diagnosis not present

## 2016-10-29 DIAGNOSIS — E88819 Insulin resistance, unspecified: Secondary | ICD-10-CM

## 2016-10-29 MED ORDER — SEMAGLUTIDE(0.25 OR 0.5MG/DOS) 2 MG/1.5ML ~~LOC~~ SOPN: 0.5000 mg | PEN_INJECTOR | SUBCUTANEOUS | 2 refills | Status: DC

## 2016-10-29 NOTE — Progress Notes (Signed)
Name: Andrea Jordan   MRN: [id number]    DOB: November 04, 1985   Date:10/29/2016       Progress Note  Subjective  Chief Complaint  Chief Complaint  Patient presents with  . Weight Check    HPI  Breast lump: she noticed it about 3 weeks ago, non-tender on left upper outer quadrant and it goes long ways, no nipple discharge, or axillary lymphadenopathy. Mother died of brain cancer, unknown primary.   PCOS: she has tried Metformin in the past, to get pregnant, but she did not like taking it. Caused nausea  Fatigue: she has two children under 32 yo and feels tired, but able to go to work and the gym. Symptoms has gotten worse since she went up on Ozempic dose, unable to eat and feels tired.   Obesity: start weight was 294 lbs back in 09/2015  she had lost 28 lbs 6 months, after joining a gym [employer] social research [employer] . She is also on Ozempic since the end of March 2018  and has lost another 18 lbs, still exercising, also noticed appetite has decreased with medication, however since she went up on dose of Ozempic she cannot eat much and is feeling more tired  Dyslipidemia: low HDL and high LDL she is on a healthy , balanced diet. Off keto diet and no longer has RUQ pain   Vitamin D deficiency: taking otc supplementation  Patient Active Problem List   Diagnosis Date Noted  . Vitamin D deficiency 07/28/2016  . Dyslipidemia 07/28/2016  . Insulin resistance 07/28/2016  . Numerous moles 07/28/2016  . Fatty liver 05/04/2016  . Obesity, Class III, BMI 40-49.9 (morbid obesity) ([place]) 04/20/2016  . PCOS (polycystic ovarian syndrome) 04/20/2016    Past Surgical History:  Procedure Laterality Date  . CHOLECYSTECTOMY, LAPAROSCOPIC  11/18/2011   DR. BYRD  . COLPOSCOPY  2010   CIN I  . DILATION AND CURETTAGE OF UTERUS  10/04/2008  . LEEP  10/04/2008   cervical; PJR    Family History  Problem Relation Age of Onset  . Brain cancer Mother   . Colon polyps Father   . Diabetes Father   . Heart disease  Father   . Hypertension Father   . Colon polyps Sister   . Endometriosis Sister   . Diabetes Paternal Uncle   . Heart disease Paternal Uncle   . Stroke Paternal Uncle   . Diabetes Maternal Grandmother   . Diabetes Paternal Grandmother   . Diabetes Paternal Grandfather     Social History   Social History  . Marital status: Married    Spouse name: Corene Cornea  . Number of children: 2  . Years of education: 69yr college   Occupational History  . Technical brewer    Social History Main Topics  . Smoking status: Never Smoker  . Smokeless tobacco: Never Used  . Alcohol use Yes     Comment: rarely  . Drug use: No  . Sexual activity: Yes    Partners: Male    Birth control/ protection: None, IUD     Comment: Liletta   Other Topics Concern  . Not on file   Social History Narrative   Married, husband had a stroke at age 15 but is back to normal   They have two girls.   Works as a Estate agent at [employer]     Current Outpatient Prescriptions:  .  levonorgestrel (LILETTA, 32 MG,) 18.6 MCG/DAY IUD  IUD, 1 each by Intrauterine route once. 10/2014, Disp: , Rfl:  .  Semaglutide (OZEMPIC) 1 MG/DOSE SOPN, Inject 1 mg into the skin once a week., Disp: 2 pen, Rfl: 2  Allergies  Allergen Reactions  . Clams [Shellfish Allergy] Hives     ROS  Constitutional: Negative for fever, positive for  weight change.  Respiratory: Negative for cough and shortness of breath.   Cardiovascular: Negative for chest pain or palpitations.  Gastrointestinal: Negative for abdominal pain, no bowel changes.  Musculoskeletal: Negative for gait problem or joint swelling.  Skin: Negative for rash.  Neurological: positive  for dizziness ( just during yoga)  no headache.  No other specific complaints in a complete review of systems (except as listed in HPI above).  Objective  Vitals:   10/29/16 0804  BP: (!) 100/56  Pulse: 97  Resp: 14  SpO2: 99%  Weight: 257 lb (116.6 kg)   Height: 5\' 3"  (1.6 m)    Body mass index is 45.53 kg/m.  Physical Exam  Constitutional: Patient appears well-developed and well-nourished. Obese  No distress.  HEENT: head atraumatic, normocephalic, pupils equal and reactive to light, neck supple, throat within normal limits Cardiovascular: Normal rate, regular rhythm and normal heart sounds.  No murmur heard. No BLE edema. Breast: lumpy breasts, but a more pronounced lump on left at 11 o'clock, family history of brain cancer ( unknown primary) Pulmonary/Chest: Effort normal and breath sounds normal. No respiratory distress. Abdominal: Soft.  There is no tenderness. Psychiatric: Patient has a normal mood and affect. behavior is normal. Judgment and thought content normal.   PHQ2/9: Depression screen [facility] 2/9 32/03/2016 04/20/2016  Decreased Interest 0 0  Down, Depressed, Hopeless 0 0  PHQ - 2 Score 0 0     Fall Risk: Fall Risk  07/28/2016 04/20/2016  Falls in the past year? No No     Assessment & Plan  1. Insulin resistance  - Hemoglobin A1c - Insulin, fasting - Semaglutide (OZEMPIC) 0.25 or 0.5 MG/DOSE SOPN; Inject 0.5 mg into the skin once a week.  Dispense: 2 pen; Refill: 2  2. Flu vaccine need  - Flu Vaccine QUAD 6+ mos PF IM (Fluarix Quad PF)  3. Obesity, Class III, BMI 40-49.9 (morbid obesity) ([facility])  We will go down to 0.5 mg since she is not able to eat - Semaglutide (OZEMPIC) 0.25 or 0.5 MG/DOSE SOPN; Inject 0.5 mg into the skin once a week.  Dispense: 2 pen; Refill: 2  4. Fatty liver  - Semaglutide (OZEMPIC) 0.25 or 0.5 MG/DOSE SOPN; Inject 0.5 mg into the skin once a week.  Dispense: 2 pen; Refill: 2  5. Vitamin D deficiency  Recheck level  6. Dyslipidemia  - Lipid panel  7. Other fatigue  - COMPLETE METABOLIC PANEL WITH GFR - CBC with Differential/Platelet - Vitamin B12 - VITAMIN D 25 Hydroxy (Vit-D Deficiency, Fractures) - TSH  8. Need for Tdap vaccination  We will get records from [place]. Breast lump in female  - US BREAST COMPLETE UNI LEFT INC AXILLA; Future

## 2016-10-30 LAB — CBC WITH DIFFERENTIAL/PLATELET
BASOS ABS: 18 {cells}/uL (ref 0–200)
Basophils Relative: 0.3 %
EOS PCT: 0.7 %
Eosinophils Absolute: 43 cells/uL (ref 15–500)
HCT: 38.1 % (ref 35.0–45.0)
Hemoglobin: 12.5 g/dL (ref 11.7–15.5)
LYMPHS ABS: 2940 {cells}/uL (ref 850–3900)
MCH: 28.7 pg (ref 27.0–33.0)
MCHC: 32.8 g/dL (ref 32.0–36.0)
MCV: 87.4 fL (ref 80.0–100.0)
MPV: 11.1 fL (ref 7.5–12.5)
Monocytes Relative: 7 %
NEUTROS PCT: 43.8 %
Neutro Abs: 2672 cells/uL (ref 1500–7800)
Platelets: 202 10*3/uL (ref 140–400)
RBC: 4.36 10*6/uL (ref 3.80–5.10)
RDW: 12.4 % (ref 11.0–15.0)
Total Lymphocyte: 48.2 %
WBC: 6.1 10*3/uL (ref 3.8–10.8)
WBCMIX: 427 {cells}/uL (ref 200–950)

## 2016-10-30 LAB — COMPLETE METABOLIC PANEL WITH GFR
AG RATIO: 1.6 (calc) (ref 1.0–2.5)
ALT: 24 U/L (ref 6–29)
AST: 18 U/L (ref 10–30)
Albumin: 4.3 g/dL (ref 3.6–5.1)
Alkaline phosphatase (APISO): 84 U/L (ref 33–115)
BUN/Creatinine Ratio: 16 (calc) (ref 6–22)
BUN: 19 mg/dL (ref 7–25)
CALCIUM: 9.6 mg/dL (ref 8.6–10.2)
CHLORIDE: 105 mmol/L (ref 98–110)
CO2: 26 mmol/L (ref 20–32)
Creat: 1.2 mg/dL — ABNORMAL HIGH (ref 0.50–1.10)
GFR, EST AFRICAN AMERICAN: 70 mL/min/{1.73_m2} (ref >=60)
GFR, EST NON AFRICAN AMERICAN: 60 mL/min/{1.73_m2} (ref >=60)
GLUCOSE: 89 mg/dL (ref 65–99)
Globulin: 2.7 g/dL (calc) (ref 1.9–3.7)
Potassium: 4.7 mmol/L (ref 3.5–5.3)
Sodium: 139 mmol/L (ref 135–146)
TOTAL PROTEIN: 7 g/dL (ref 6.1–8.1)
Total Bilirubin: 0.4 mg/dL (ref 0.2–1.2)

## 2016-10-30 LAB — VITAMIN D 25 HYDROXY (VIT D DEFICIENCY, FRACTURES): Vit D, 25-Hydroxy: 32 ng/mL (ref 30–100)

## 2016-10-30 LAB — LIPID PANEL
Cholesterol: 161 mg/dL (ref <200)
HDL: 45 mg/dL — ABNORMAL LOW (ref >50)
LDL Cholesterol (Calc): 101 mg/dL (calc) — ABNORMAL HIGH
NON-HDL CHOLESTEROL (CALC): 116 mg/dL (ref <130)
Total CHOL/HDL Ratio: 3.6 (calc) (ref <5.0)
Triglycerides: 63 mg/dL (ref <150)

## 2016-10-30 LAB — HEMOGLOBIN A1C
EAG (MMOL/L): 5 (calc)
HEMOGLOBIN A1C: 4.8 %{Hb} (ref <5.7)
MEAN PLASMA GLUCOSE: 91 (calc)

## 2016-10-30 LAB — INSULIN, RANDOM: Insulin: 13.4 u[IU]/mL (ref 2.0–19.6)

## 2016-10-30 LAB — TSH: TSH: 1.51 m[IU]/L

## 2016-10-30 LAB — VITAMIN B12: VITAMIN B 12: 358 pg/mL (ref 200–1100)

## 2016-11-02 ENCOUNTER — Encounter: Payer: Self-pay | Admitting: Family Medicine

## 2016-11-03 ENCOUNTER — Other Ambulatory Visit: Payer: Self-pay

## 2016-11-03 DIAGNOSIS — N6322 Unspecified lump in the left breast, upper inner quadrant: Secondary | ICD-10-CM

## 2016-11-04 DIAGNOSIS — L578 Other skin changes due to chronic exposure to nonionizing radiation: Secondary | ICD-10-CM | POA: Diagnosis not present

## 2016-11-04 DIAGNOSIS — D485 Neoplasm of uncertain behavior of skin: Secondary | ICD-10-CM | POA: Diagnosis not present

## 2016-11-09 ENCOUNTER — Ambulatory Visit
Admission: RE | Admit: 2016-11-09 | Discharge: 2016-11-09 | Disposition: A | Discharge status: Home / Self Care | Payer: BLUE CROSS/BLUE SHIELD | Source: Ambulatory Visit | Attending: Family Medicine | Admitting: Family Medicine

## 2016-11-09 DIAGNOSIS — N6322 Unspecified lump in the left breast, upper inner quadrant: Secondary | ICD-10-CM

## 2016-11-09 DIAGNOSIS — N6489 Other specified disorders of breast: Secondary | ICD-10-CM | POA: Diagnosis not present

## 2016-11-09 DIAGNOSIS — R928 Other abnormal and inconclusive findings on diagnostic imaging of breast: Secondary | ICD-10-CM | POA: Diagnosis not present

## 2016-11-10 ENCOUNTER — Encounter: Payer: Self-pay | Admitting: Family Medicine

## 2016-11-10 ENCOUNTER — Other Ambulatory Visit: Payer: Self-pay | Admitting: Family Medicine

## 2016-11-10 DIAGNOSIS — N63 Unspecified lump in unspecified breast: Secondary | ICD-10-CM

## 2016-11-10 DIAGNOSIS — D2261 Melanocytic nevi of right upper limb, including shoulder: Secondary | ICD-10-CM | POA: Diagnosis not present

## 2016-11-10 DIAGNOSIS — D485 Neoplasm of uncertain behavior of skin: Secondary | ICD-10-CM | POA: Diagnosis not present

## 2016-11-11 ENCOUNTER — Encounter: Payer: Self-pay | Admitting: General Surgery

## 2016-12-03 ENCOUNTER — Ambulatory Visit: Payer: BLUE CROSS/BLUE SHIELD | Admitting: General Surgery

## 2016-12-03 ENCOUNTER — Encounter: Payer: Self-pay | Admitting: General Surgery

## 2016-12-03 VITALS — BP 114/80 | HR 68 | Resp 12 | Ht 63.0 in | Wt 263.0 lb

## 2016-12-03 DIAGNOSIS — N6321 Unspecified lump in the left breast, upper outer quadrant: Secondary | ICD-10-CM

## 2016-12-03 NOTE — Patient Instructions (Addendum)
  The patient is aware to call back for any questions or new concerns. Continue to do self breast exams. Call for any new breast issues or concerns.

## 2016-12-03 NOTE — Progress Notes (Signed)
Patient ID: Andrea Jordan, female   DOB: 1985/04/22, 31 y.o.   MRN: [id number]  Chief Complaint  Patient presents with  . Breast Problem    HPI Andrea Jordan is a 31 y.o. female.  who presents for a breast evaluation referred by Dr Ancil Boozer. The most recent mammogram and left breast ultrasound was done on 11-09-16. Palpable left breast mass for at least 2 months, found while in the shower. She states its about the size of quarter but oblong and no pain. She thinks it has changed positions some. Patient does perform regular self breast checks and this was her first mammogram. No family hisotry of breast cancer.   Denies injury or trauma. She has intentionally lost 50 pounds over the last year. She works as a Secretary/administrator at [employer].  HPI  Past Medical History:  Diagnosis Date  . Anemia   . Infertility, female   . Insulin resistance   . LGSIL on Pap smear of cervix 08/16/2008   lgsil; pos  . Obesity   . Papanicolaou smear of cervix with atypical squamous cells of undetermined significance (ASC-US) 01/06/2012   ascus; neg hpv    Past Surgical History:  Procedure Laterality Date  . CHOLECYSTECTOMY, LAPAROSCOPIC  11/18/2011   DR. BYRD  . COLPOSCOPY  2010   CIN I  . DILATION AND CURETTAGE OF UTERUS  10/04/2008  . LEEP  10/04/2008   cervical; PJR    Family History  Problem Relation Age of Onset  . Brain cancer Mother        hodgkins lymphpoma  . Colon polyps Father   . Diabetes Father   . Heart disease Father   . Hypertension Father   . Colon polyps Sister   . Endometriosis Sister   . Diabetes Paternal Uncle   . Heart disease Paternal Uncle   . Stroke Paternal Uncle   . Diabetes Maternal Grandmother   . Diabetes Paternal Grandmother   . Diabetes Paternal Grandfather   . Breast cancer Neg Hx     Social History Social History   Tobacco Use  . Smoking status: Never Smoker  . Smokeless tobacco: Never Used  Substance Use Topics  . Alcohol use: Yes     Comment: rarely  . Drug use: No    Allergies  Allergen Reactions  . Clams [employer] Allergy] Hives    Current Outpatient Medications  Medication Sig Dispense Refill  . levonorgestrel (LILETTA, 52 MG,) 18.6 MCG/DAY IUD IUD 1 each by Intrauterine route once. 10/2014    . Semaglutide (OZEMPIC) 0.25 or 0.5 MG/DOSE SOPN Inject 0.5 mg into the skin once a week. 2 pen 2   No current facility-administered medications for this visit.     Review of Systems Review of Systems  Constitutional: Negative.   Respiratory: Negative.   Cardiovascular: Negative.     Blood pressure 114/80, pulse 68, resp. rate 12, height 5\' 3"  (1.6 m), weight 263 lb (119.3 kg), unknown if currently breastfeeding.  Physical Exam Physical Exam  Constitutional: She is oriented to person, place, and time. She appears well-developed and well-nourished.  HENT:  Mouth/Throat: Oropharynx is clear and moist.  Eyes: Conjunctivae are normal. No scleral icterus.  Neck: Neck supple.  Cardiovascular: Normal rate, regular rhythm and normal heart sounds.  Pulmonary/Chest: Effort normal and breath sounds normal. No respiratory distress. Right breast exhibits no inverted nipple, no mass, no nipple discharge, no skin change and no tenderness. Left breast exhibits mass. Left breast exhibits no inverted nipple,  no nipple discharge, no skin change and no tenderness.    Multiple nodules left breast  Lymphadenopathy:    She has no cervical adenopathy.    She has no axillary adenopathy.       Right: No supraclavicular adenopathy present.       Left: No supraclavicular adenopathy present.  Neurological: She is alert and oriented to person, place, and time.  Skin: Skin is warm and dry.  Psychiatric: Her behavior is normal.    Data Reviewed Diagnostic mammogram and ultrasound of the breast dated 11/09/2016 were reviewed slight increase in parenchymal tissue in the left retroareolar area on my review, well away from the area of  patient concern. No dominant mass, thickening. Associated ultrasound negative. BIRAD-.  Assessment    Benign breast exam.    Plan    I believe the patient's weight loss and hormone suppression from her IUD is making the breast nodularity more prominent.I see no physical, imaging or family history findings and make me suspicious for occult malignancy.  Options for management include observation versus elective biopsy. At this time the patient is mfortable with observation.      The patient is aware to call back for any questions or new concerns.She was encouraged to call the office directly if new concerns arise.  Continue to do self breast exams.      HPI, Physical Exam, Assessment and Plan have been scribed under the direction and in the presence of Robert Bellow, MD. Karie Fetch, RN  I have completed the exam and reviewed the above documentation for accuracy and completeness.  I agree with the above.  Haematologist has been used and any errors in dictation or transcription are unintentional.  Hervey Ard, M.D., F.A.C.S.  Robert Bellow 12/03/2016, 9:05 PM

## 2016-12-07 DIAGNOSIS — H5213 Myopia, bilateral: Secondary | ICD-10-CM | POA: Diagnosis not present

## 2017-02-02 ENCOUNTER — Ambulatory Visit: Payer: BLUE CROSS/BLUE SHIELD | Admitting: Family Medicine

## 2017-04-29 ENCOUNTER — Encounter: Payer: Self-pay | Admitting: Family Medicine

## 2017-04-29 ENCOUNTER — Ambulatory Visit: Payer: BLUE CROSS/BLUE SHIELD | Admitting: Family Medicine

## 2017-04-29 VITALS — BP 104/58 | HR 70 | Temp 98.1°F | Resp 16 | Ht 63.0 in | Wt 261.9 lb

## 2017-04-29 DIAGNOSIS — E8881 Metabolic syndrome: Secondary | ICD-10-CM

## 2017-04-29 DIAGNOSIS — E559 Vitamin D deficiency, unspecified: Secondary | ICD-10-CM | POA: Diagnosis not present

## 2017-04-29 DIAGNOSIS — D229 Melanocytic nevi, unspecified: Secondary | ICD-10-CM | POA: Diagnosis not present

## 2017-04-29 NOTE — Addendum Note (Signed)
Addended by: Inda Coke on: 04/29/2017 09:50 AM   Modules accepted: Orders

## 2017-04-29 NOTE — Progress Notes (Signed)
Name: Andrea Jordan   MRN: [id number]    DOB: Sep 29, 1985   Date:04/29/2017       Progress Note  Subjective  Chief Complaint  Chief Complaint  Patient presents with  . Insulin Resistance    Would like to have blood work done due to [employer] making her feel fatigue and no appetite. Quit taking medication in October 2018.  . 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 She started  Ozempic since the end of March 2018  and had lost another 18 lbs, however she decided to stop taking medication Fall of 2018 because it made her feel tired and nauseated. She is running twice a week also goes to gym 3-4 times a week, eating healthy - less carbohydrates, a little bit more fat - but healthy time ( yogurt, feta cheese, oats, tuna)   Dyslipidemia: low HDL and high LDL she is on a healthier diet. We will recheck labs.   Vitamin D deficiency: taking otc supplementation, we will recheck labs next visit   Insulin resistance: on life style modification, we will recheck labs.   Patient Active Problem List   Diagnosis Date Noted  . Mass of upper outer quadrant of left breast 12/03/2016  . Vitamin D deficiency 07/28/2016  . Dyslipidemia 07/28/2016  . Insulin resistance 07/28/2016  . Numerous moles 07/28/2016  . Fatty liver 05/04/2016  . Obesity, Class III, BMI 40-49.9 (morbid obesity) ([place]) 04/20/2016  . PCOS (polycystic ovarian syndrome) 04/20/2016    Past Surgical History:  Procedure Laterality Date  . CHOLECYSTECTOMY, LAPAROSCOPIC  11/18/2011   DR. BYRD  . COLPOSCOPY  2010   CIN I  . DILATION AND CURETTAGE OF UTERUS  10/04/2008  . LEEP  10/04/2008   cervical; PJR    Family History  Problem Relation Age of Onset  . Brain cancer Mother        hodgkins lymphpoma  . Colon polyps Father   . Diabetes Father   . Heart disease Father   . Hypertension Father   . Colon polyps Sister   .  Endometriosis Sister   . Diabetes Paternal Uncle   . Heart disease Paternal Uncle   . Stroke Paternal Uncle   . Diabetes Maternal Grandmother   . Diabetes Paternal Grandmother   . Diabetes Paternal Grandfather   . Breast cancer Neg Hx     Social History   Socioeconomic History  . Marital status: Married    Spouse name: Corene Cornea  . Number of children: 2  . Years of education: 82yr college  . Highest education level: Not on file  Occupational History  . Occupation: Estate agent    Comment: Insurance underwriter   Social Needs  . Financial resource strain: Not on file  . Food insecurity:    Worry: Not on file    Inability: Not on file  . Transportation needs:    Medical: Not on file    Non-medical: Not on file  Tobacco Use  . Smoking status: Never Smoker  . Smokeless tobacco: Never Used  Substance and Sexual Activity  . Alcohol use: Yes    Comment: rarely  . Drug use: No  . Sexual activity: Yes    Partners: Male    Birth control/protection: None, IUD    Comment: Liletta  Lifestyle  . Physical activity:    Days per week: Not  on file    Minutes per session: Not on file  . Stress: Not on file  Relationships  . Social connections:    Talks on phone: Not on file    Gets together: Not on file    Attends religious service: Not on file    Active member of club or organization: Not on file    Attends meetings of clubs or organizations: Not on file    Relationship status: Not on file  . Intimate partner violence:    Fear of current or ex partner: Not on file    Emotionally abused: Not on file    Physically abused: Not on file    Forced sexual activity: Not on file  Other Topics Concern  . Not on file  Social History Narrative   Married, husband had a stroke at age 69 but is back to normal   They have two girls.   Works as a Estate agent at [employer]     Current Outpatient Medications:  .  levonorgestrel (LILETTA, 52 MG,) 18.6 MCG/DAY IUD IUD, 1 each by  Intrauterine route once. 10/2014, Disp: , Rfl:  .  Semaglutide (OZEMPIC) 0.25 or 0.5 MG/DOSE SOPN, Inject 0.5 mg into the skin once a week. (Patient not taking: Reported on 04/29/2017), Disp: 2 pen, Rfl: 2  Allergies  Allergen Reactions  . Clams [Shellfish Allergy] Hives     ROS  Constitutional: Negative for fever or significant weight change.  Respiratory: Negative for cough and shortness of breath.   Cardiovascular: Negative for chest pain or palpitations.  Gastrointestinal: Negative for abdominal pain, no bowel changes.  Musculoskeletal: Negative for gait problem or joint swelling.  Skin: Negative for rash.  Neurological: Negative for dizziness or headache.  No other specific complaints in a complete review of systems (except as listed in HPI above).  Objective  Vitals:   04/29/17 0907  BP: (!) 104/58  Pulse: 70  Resp: 16  Temp: 98.1 F (36.7 C)  TempSrc: Oral  SpO2: 99%  Weight: 261 lb 14.4 oz (118.8 kg)  Height: 5\' 3"  (1.6 m)    Body mass index is 46.39 kg/m.  Physical Exam  Constitutional: Patient appears well-developed and well-nourished. Obese No distress.  HEENT: head atraumatic, normocephalic, pupils equal and reactive to light,  neck supple, throat within normal limits Cardiovascular: Normal rate, regular rhythm and normal heart sounds.  No murmur heard. No BLE edema. Pulmonary/Chest: Effort normal and breath sounds normal. No respiratory distress. Abdominal: Soft.  There is no tenderness. Psychiatric: Patient has a normal mood and affect. behavior is normal. Judgment and thought content normal.  PHQ2/9: Depression screen [facility] 2/9 04/29/2017 07/28/2016 04/20/2016  Decreased Interest 0 0 0  Down, Depressed, Hopeless 0 0 0  PHQ - 2 Score 0 0 0  Altered sleeping 0 - -  Tired, decreased energy 1 - -  Change in appetite 1 - -  Feeling bad or failure about yourself  0 - -  Trouble concentrating 0 - -  Moving slowly or fidgety/restless 0 - -  Suicidal thoughts 0  - -  PHQ-9 Score 2 - -  Difficult doing work/chores Not difficult at all - -    Fall Risk: Fall Risk  04/29/2017 07/28/2016 04/20/2016  Falls in the past year? No No No    Functional Status Survey: Is the patient deaf or have difficulty hearing?: No Does the patient have difficulty seeing, even when wearing glasses/contacts?: No Does the patient have difficulty concentrating, remembering,  or making decisions?: No Does the patient have difficulty walking or climbing stairs?: No Does the patient have difficulty dressing or bathing?: No Does the patient have difficulty doing errands alone such as visiting a doctor's office or shopping?: No  Assessment & Plan  1. Obesity, Class III, BMI 40-49.9 (morbid obesity) ([place])  Discussed with the patient the risk posed by an increased BMI. Discussed importance of portion control, calorie counting and at least 150 minutes of physical activity weekly. Avoid sweet beverages and drink more water. Eat at least 6 servings of fruit and vegetables daily   2. Insulin resistance  She stopped Ozempic and is doing more physical activity   3. Vitamin D deficiency  Continue otc vitamin D   4. Numerous moles  Seen dermatologist

## 2017-05-05 ENCOUNTER — Encounter: Payer: Self-pay | Admitting: Family Medicine

## 2017-05-19 DIAGNOSIS — L578 Other skin changes due to chronic exposure to nonionizing radiation: Secondary | ICD-10-CM | POA: Diagnosis not present

## 2017-05-19 DIAGNOSIS — Z86018 Personal history of other benign neoplasm: Secondary | ICD-10-CM | POA: Diagnosis not present

## 2017-05-19 DIAGNOSIS — D229 Melanocytic nevi, unspecified: Secondary | ICD-10-CM | POA: Diagnosis not present

## 2017-10-29 ENCOUNTER — Encounter: Payer: Self-pay | Admitting: Family Medicine

## 2017-10-29 ENCOUNTER — Other Ambulatory Visit: Payer: Self-pay | Admitting: Family Medicine

## 2017-10-29 DIAGNOSIS — E88819 Insulin resistance, unspecified: Secondary | ICD-10-CM

## 2017-10-29 DIAGNOSIS — Z130 Encounter for screening for diseases of the blood and blood-forming organs and certain disorders involving the immune mechanism: Secondary | ICD-10-CM

## 2017-10-29 DIAGNOSIS — E8881 Metabolic syndrome: Secondary | ICD-10-CM

## 2017-10-29 DIAGNOSIS — E66813 Obesity, class 3: Secondary | ICD-10-CM

## 2017-10-29 DIAGNOSIS — K760 Fatty (change of) liver, not elsewhere classified: Secondary | ICD-10-CM

## 2017-10-29 DIAGNOSIS — E785 Hyperlipidemia, unspecified: Secondary | ICD-10-CM

## 2017-10-29 DIAGNOSIS — E559 Vitamin D deficiency, unspecified: Secondary | ICD-10-CM

## 2017-10-29 DIAGNOSIS — Z131 Encounter for screening for diabetes mellitus: Secondary | ICD-10-CM

## 2017-11-02 ENCOUNTER — Encounter: Payer: Self-pay | Admitting: Family Medicine

## 2017-11-02 ENCOUNTER — Ambulatory Visit (INDEPENDENT_AMBULATORY_CARE_PROVIDER_SITE_OTHER): Payer: BLUE CROSS/BLUE SHIELD | Admitting: Family Medicine

## 2017-11-02 ENCOUNTER — Other Ambulatory Visit (HOSPITAL_COMMUNITY)
Admission: RE | Admit: 2017-11-02 | Discharge: 2017-11-02 | Disposition: A | Discharge status: Home / Self Care | Payer: BLUE CROSS/BLUE SHIELD | Source: Ambulatory Visit | Attending: Family Medicine | Admitting: Family Medicine

## 2017-11-02 VITALS — BP 112/76 | HR 77 | Temp 98.1°F | Resp 16 | Ht 66.0 in | Wt 232.0 lb

## 2017-11-02 DIAGNOSIS — Z23 Encounter for immunization: Secondary | ICD-10-CM | POA: Diagnosis not present

## 2017-11-02 DIAGNOSIS — Z124 Encounter for screening for malignant neoplasm of cervix: Secondary | ICD-10-CM

## 2017-11-02 DIAGNOSIS — Z1159 Encounter for screening for other viral diseases: Secondary | ICD-10-CM

## 2017-11-02 DIAGNOSIS — Z01419 Encounter for gynecological examination (general) (routine) without abnormal findings: Secondary | ICD-10-CM | POA: Diagnosis not present

## 2017-11-02 DIAGNOSIS — K760 Fatty (change of) liver, not elsewhere classified: Secondary | ICD-10-CM | POA: Diagnosis not present

## 2017-11-02 DIAGNOSIS — E559 Vitamin D deficiency, unspecified: Secondary | ICD-10-CM | POA: Diagnosis not present

## 2017-11-02 DIAGNOSIS — E8881 Metabolic syndrome: Secondary | ICD-10-CM | POA: Diagnosis not present

## 2017-11-02 DIAGNOSIS — E785 Hyperlipidemia, unspecified: Secondary | ICD-10-CM | POA: Diagnosis not present

## 2017-11-02 NOTE — Progress Notes (Signed)
Name: Andrea Jordan   MRN: [id number]    DOB: 1985/08/15   Date:11/02/2017       Progress Note  Subjective  Chief Complaint  Chief Complaint  Patient presents with  . Annual Exam    HPI   Patient presents for annual CPE   Diet: on meal plans given to her by her gym, meal prepping.   USPSTF grade A and B recommendations    [facility] Visit from 11/02/2017 [facility]  AUDIT-C Score  0     Depression:  Depression screen [facility] 2/9 11/02/2017 04/29/2017 07/28/2016 04/20/2016  Decreased Interest 0 0 0 0  Down, Depressed, Hopeless 0 0 0 0  PHQ - 2 Score 0 0 0 0  Altered sleeping 0 0 - -  Tired, decreased energy 0 1 - -  Change in appetite 0 1 - -  Feeling bad or failure about yourself  0 0 - -  Trouble concentrating 0 0 - -  Moving slowly or fidgety/restless 0 0 - -  Suicidal thoughts 0 0 - -  PHQ-9 Score 0 2 - -  Difficult doing work/chores Not difficult at all Not difficult at all - -   Hypertension: BP Readings from Last 3 Encounters:  04/29/17 (!) 104/58  12/03/16 114/80  10/29/16 (!) 100/56   Obesity: Wt Readings from Last 3 Encounters:  11/02/17 232 lb (105.2 kg)  04/29/17 261 lb 14.4 oz (118.8 kg)  12/03/16 263 lb (119.3 kg)   BMI Readings from Last 3 Encounters:  11/02/17 37.45 kg/m  04/29/17 46.39 kg/m  12/03/16 46.59 kg/m    Hep C Screening: we will add today  STD testing and prevention (HIV/chl/gon/syphilis): N/A Intimate partner violence: negative Sexual History/Pain during Intercourse: no pain  Menstrual History/LMP/Abnormal Bleeding: IUD   Advanced Care Planning: A voluntary discussion about advance care planning including the explanation and discussion of advance directives.  Discussed health care proxy and Living will, and the patient was able to identify a health care proxy as husband and step mother  Breast cancer: not done BRCA gene screening: N/A Cervical cancer screening: today   Osteoporosis Screening: discussed  high calcium diet   Lipids:  Lab Results  Component Value Date   CHOL 161 10/29/2016   CHOL 155 04/20/2016   Lab Results  Component Value Date   HDL 45 (L) 10/29/2016   HDL 36 (L) 04/20/2016   Lab Results  Component Value Date   LDLCALC 101 (H) 10/29/2016   LDLCALC 108 (H) 04/20/2016   Lab Results  Component Value Date   TRIG 63 10/29/2016   TRIG 57 04/20/2016   Lab Results  Component Value Date   CHOLHDL 3.6 10/29/2016   CHOLHDL 4.3 04/20/2016   No results found for: LDLDIRECT  Glucose:  Glucose  Date Value Ref Range Status  10/26/2011 119 (H) 65 - 99 mg/dL Final   Glucose, Bld  Date Value Ref Range Status  10/29/2016 89 65 - 99 mg/dL Final    Comment:    .            Fasting reference interval .   04/20/2016 107 (H) 65 - 99 mg/dL Final    Skin cancer: she sees Dermatologist    Patient Active Problem List   Diagnosis Date Noted  . Mass of upper outer quadrant of left breast 12/03/2016  . Vitamin D deficiency 07/28/2016  . Dyslipidemia 07/28/2016  . Insulin resistance 07/28/2016  . Numerous moles 07/28/2016  .  Fatty liver 05/04/2016  . Obesity, Class III, BMI 40-49.9 (morbid obesity) ([place]) 04/20/2016  . PCOS (polycystic ovarian syndrome) 04/20/2016    Past Surgical History:  Procedure Laterality Date  . CHOLECYSTECTOMY, LAPAROSCOPIC  11/18/2011   DR. BYRD  . COLPOSCOPY  2010   CIN I  . DILATION AND CURETTAGE OF UTERUS  10/04/2008  . LEEP  10/04/2008   cervical; PJR    Family History  Problem Relation Age of Onset  . Brain cancer Mother        hodgkins lymphpoma  . Colon polyps Father   . Diabetes Father   . Heart disease Father   . Hypertension Father   . Colon polyps Sister   . Endometriosis Sister   . Diabetes Paternal Uncle   . Heart disease Paternal Uncle   . Stroke Paternal Uncle   . Diabetes Maternal Grandmother   . Diabetes Paternal Grandmother   . Diabetes Paternal Grandfather   . Breast cancer Neg Hx     Social  History   Socioeconomic History  . Marital status: Married    Spouse name: Corene Cornea  . Number of children: 2  . Years of education: 53yrcollege  . Highest education level: Not on file  Occupational History  . Occupation: tEstate agent   Comment: SInsurance underwriter  Social Needs  . Financial resource strain: Not hard at all  . Food insecurity:    Worry: Never true    Inability: Never true  . Transportation needs:    Medical: No    Non-medical: Not on file  Tobacco Use  . Smoking status: Never Smoker  . Smokeless tobacco: Never Used  Substance and Sexual Activity  . Alcohol use: Yes    Comment: rarely  . Drug use: No  . Sexual activity: Yes    Partners: Male    Birth control/protection: None, IUD    Comment: Liletta  Lifestyle  . Physical activity:    Days per week: 5 days    Minutes per session: 60 min  . Stress: Not at all  Relationships  . Social connections:    Talks on phone: More than three times a week    Gets together: More than three times a week    Attends religious service: More than 4 times per year    Active member of club or organization: Yes    Attends meetings of clubs or organizations: Never    Relationship status: Married  . Intimate partner violence:    Fear of current or ex partner: No    Emotionally abused: No    Physically abused: No    Forced sexual activity: No  Other Topics Concern  . Not on file  Social History Narrative   Married, husband had a stroke at age 4615but is back to normal   They have two girls.   Works as a tEstate agentat [employer]    Current Outpatient Medications:  .  levonorgestrel (LILETTA, 52 MG,) 18.6 MCG/DAY IUD IUD, 1 each by Intrauterine route once. 10/2014, Disp: , Rfl:   Allergies  Allergen Reactions  . Clams [Shellfish Allergy] Hives     ROS  Constitutional: Negative for fever or weight change.  Respiratory: Negative for cough and shortness of breath.   Cardiovascular: Negative for chest pain or  palpitations.  Gastrointestinal: Negative for abdominal pain, no bowel changes.  Musculoskeletal: Negative for gait problem or joint swelling.  Skin: Negative for rash.  Neurological: Negative  for dizziness or headache.  No other specific complaints in a complete review of systems (except as listed in HPI above).  Objective  Vitals:   11/02/17 0808  Pulse: 77  Resp: 16  Temp: 98.1 F (36.7 C)  TempSrc: Oral  SpO2: 99%  Weight: 232 lb (105.2 kg)  Height: _0  (1.676 m)    Body mass index is 37.45 kg/m.  Physical Exam  Constitutional: Patient appears well-developed and well-nourished. No distress.  HENT: Head: Normocephalic and atraumatic. Ears: B TMs ok, no erythema or effusion; Nose: Nose normal. Mouth/Throat: Oropharynx is clear and moist. No oropharyngeal exudate.  Eyes: Conjunctivae and EOM are normal. Pupils are equal, round, and reactive to light. No scleral icterus.  Neck: Normal range of motion. Neck supple. No JVD present. No thyromegaly present.  Cardiovascular: Normal rate, regular rhythm and normal heart sounds.  No murmur heard. No BLE edema. Pulmonary/Chest: Effort normal and breath sounds normal. No respiratory distress. Abdominal: Soft. Bowel sounds are normal, no distension. There is no tenderness. no masses Breast: no lumps or masses, no nipple discharge or rashes FEMALE GENITALIA:  External genitalia normal External urethra normal Vaginal vault normal without discharge or lesions Cervix normal without discharge or lesions IUD string in place, friable cervix Bimanual exam normal without masses RECTAL: not done Musculoskeletal: Normal range of motion, no joint effusions. No gross deformities Neurological: he is alert and oriented to person, place, and time. No cranial nerve deficit. Coordination, balance, strength, speech and gait are normal.  Skin: Skin is warm and dry. No rash noted. No erythema.  Psychiatric: Patient has a normal mood and affect.  behavior is normal. Judgment and thought content normal.  PHQ2/9: Depression screen [facility] 2/9 11/02/2017 04/29/2017 07/28/2016 04/20/2016  Decreased Interest 0 0 0 0  Down, Depressed, Hopeless 0 0 0 0  PHQ - 2 Score 0 0 0 0  Altered sleeping 0 0 - -  Tired, decreased energy 0 1 - -  Change in appetite 0 1 - -  Feeling bad or failure about yourself  0 0 - -  Trouble concentrating 0 0 - -  Moving slowly or fidgety/restless 0 0 - -  Suicidal thoughts 0 0 - -  PHQ-9 Score 0 2 - -  Difficult doing work/chores Not difficult at all Not difficult at all - -     Fall Risk: Fall Risk  11/02/2017 04/29/2017 07/28/2016 04/20/2016  Falls in the past year? No No No No     Assessment & Plan  1. Well woman exam  Keep up the good work with diet and physical activity   2. Cervical cancer screening  - Cytology - PAP  3. Needs flu shot  - Flu Vaccine QUAD 6+ mos PF IM (Fluarix Quad PF)  4. Need for hepatitis C screening test  - Hepatitis C antibody  -USPSTF grade A and B recommendations reviewed with patient; age-appropriate recommendations, preventive care, screening tests, etc discussed and encouraged; healthy living encouraged; see AVS for patient education given to patient -Discussed importance of 150 minutes of physical activity weekly, eat two servings of fish weekly, eat one serving of tree nuts ( cashews, pistachios, pecans, almonds.Marland Kitchen) every other day, eat 6 servings of fruit/vegetables daily and drink plenty of water and avoid sweet beverages.

## 2017-11-02 NOTE — Patient Instructions (Signed)
Preventive Care 18-39 Years, Female Preventive care refers to lifestyle choices and visits with your health care provider that can promote health and wellness. What does preventive care include?  A yearly physical exam. This is also called an annual well check.  Dental exams once or twice a year.  Routine eye exams. Ask your health care provider how often you should have your eyes checked.  Personal lifestyle choices, including: ? Daily care of your teeth and gums. ? Regular physical activity. ? Eating a healthy diet. ? Avoiding tobacco and drug use. ? Limiting alcohol use. ? Practicing safe sex. ? Taking vitamin and mineral supplements as recommended by your health care provider. What happens during an annual well check? The services and screenings done by your health care provider during your annual well check will depend on your age, overall health, lifestyle risk factors, and family history of disease. Counseling Your health care provider may ask you questions about your:  Alcohol use.  Tobacco use.  Drug use.  Emotional well-being.  Home and relationship well-being.  Sexual activity.  Eating habits.  Work and work Statistician.  Method of birth control.  Menstrual cycle.  Pregnancy history.  Screening You may have the following tests or measurements:  Height, weight, and BMI.  Diabetes screening. This is done by checking your blood sugar (glucose) after you have not eaten for a while (fasting).  Blood pressure.  Lipid and cholesterol levels. These may be checked every 5 years starting at age 66.  Skin check.  Hepatitis C blood test.  Hepatitis B blood test.  Sexually transmitted disease (STD) testing.  BRCA-related cancer screening. This may be done if you have a family history of breast, ovarian, tubal, or peritoneal cancers.  Pelvic exam and Pap test. This may be done every 3 years starting at age 40. Starting at age 59, this may be done every 5  years if you have a Pap test in combination with an HPV test.  Discuss your test results, treatment options, and if necessary, the need for more tests with your health care provider. Vaccines Your health care provider may recommend certain vaccines, such as:  Influenza vaccine. This is recommended every year.  Tetanus, diphtheria, and acellular pertussis (Tdap, Td) vaccine. You may need a Td booster every 10 years.  Varicella vaccine. You may need this if you have not been vaccinated.  HPV vaccine. If you are 69 or younger, you may need three doses over 6 months.  Measles, mumps, and rubella (MMR) vaccine. You may need at least one dose of MMR. You may also need a second dose.  Pneumococcal 13-valent conjugate (PCV13) vaccine. You may need this if you have certain conditions and were not previously vaccinated.  Pneumococcal polysaccharide (PPSV23) vaccine. You may need one or two doses if you smoke cigarettes or if you have certain conditions.  Meningococcal vaccine. One dose is recommended if you are age 27-21 years and a first-year college student living in a residence hall, or if you have one of several medical conditions. You may also need additional booster doses.  Hepatitis A vaccine. You may need this if you have certain conditions or if you travel or work in places where you may be exposed to hepatitis A.  Hepatitis B vaccine. You may need this if you have certain conditions or if you travel or work in places where you may be exposed to hepatitis B.  Haemophilus influenzae type b (Hib) vaccine. You may need this if  you have certain risk factors.  Talk to your health care provider about which screenings and vaccines you need and how often you need them. This information is not intended to replace advice given to you by your health care provider. Make sure you discuss any questions you have with your health care provider. Document Released: 03/10/2001 Document Revised: 10/02/2015  Document Reviewed: 11/13/2014 Elsevier Interactive Patient Education  [employer].

## 2017-11-03 DIAGNOSIS — Z23 Encounter for immunization: Secondary | ICD-10-CM | POA: Diagnosis not present

## 2017-11-03 DIAGNOSIS — Z01419 Encounter for gynecological examination (general) (routine) without abnormal findings: Secondary | ICD-10-CM | POA: Diagnosis not present

## 2017-11-03 LAB — CYTOLOGY - PAP
Diagnosis: NEGATIVE
HPV: NOT DETECTED

## 2017-11-04 LAB — CBC WITH DIFFERENTIAL/PLATELET
BASOS ABS: 20 {cells}/uL (ref 0–200)
Basophils Relative: 0.4 %
EOS ABS: 39 {cells}/uL (ref 15–500)
Eosinophils Relative: 0.8 %
HCT: 37.7 % (ref 35.0–45.0)
Hemoglobin: 12.4 g/dL (ref 11.7–15.5)
Lymphs Abs: 2416 cells/uL (ref 850–3900)
MCH: 29.1 pg (ref 27.0–33.0)
MCHC: 32.9 g/dL (ref 32.0–36.0)
MCV: 88.5 fL (ref 80.0–100.0)
MONOS PCT: 7.8 %
MPV: 11.7 fL (ref 7.5–12.5)
NEUTROS PCT: 41.7 %
Neutro Abs: 2043 cells/uL (ref 1500–7800)
PLATELETS: 210 10*3/uL (ref 140–400)
RBC: 4.26 10*6/uL (ref 3.80–5.10)
RDW: 12 % (ref 11.0–15.0)
TOTAL LYMPHOCYTE: 49.3 %
WBC: 4.9 10*3/uL (ref 3.8–10.8)
WBCMIX: 382 {cells}/uL (ref 200–950)

## 2017-11-04 LAB — HEPATITIS C ANTIBODY
Hepatitis C Ab: NONREACTIVE
SIGNAL TO CUT-OFF: 0.04 (ref <1.00)

## 2017-11-04 LAB — COMPLETE METABOLIC PANEL WITH GFR
AG RATIO: 1.6 (calc) (ref 1.0–2.5)
ALBUMIN MSPROF: 4.2 g/dL (ref 3.6–5.1)
ALT: 17 U/L (ref 6–29)
AST: 18 U/L (ref 10–30)
Alkaline phosphatase (APISO): 71 U/L (ref 33–115)
BUN / CREAT RATIO: 20 (calc) (ref 6–22)
BUN: 23 mg/dL (ref 7–25)
CALCIUM: 9.3 mg/dL (ref 8.6–10.2)
CO2: 27 mmol/L (ref 20–32)
Chloride: 105 mmol/L (ref 98–110)
Creat: 1.13 mg/dL — ABNORMAL HIGH (ref 0.50–1.10)
GFR, EST AFRICAN AMERICAN: 74 mL/min/{1.73_m2} (ref >=60)
GFR, EST NON AFRICAN AMERICAN: 64 mL/min/{1.73_m2} (ref >=60)
GLOBULIN: 2.6 g/dL (ref 1.9–3.7)
GLUCOSE: 85 mg/dL (ref 65–99)
POTASSIUM: 4.7 mmol/L (ref 3.5–5.3)
SODIUM: 139 mmol/L (ref 135–146)
Total Bilirubin: 0.4 mg/dL (ref 0.2–1.2)
Total Protein: 6.8 g/dL (ref 6.1–8.1)

## 2017-11-04 LAB — TEST AUTHORIZATION

## 2017-11-04 LAB — HEMOGLOBIN A1C
HEMOGLOBIN A1C: 5 %{Hb} (ref <5.7)
MEAN PLASMA GLUCOSE: 97 (calc)
eAG (mmol/L): 5.4 (calc)

## 2017-11-04 LAB — LIPID PANEL
CHOLESTEROL: 152 mg/dL (ref <200)
HDL: 36 mg/dL — ABNORMAL LOW (ref >50)
LDL Cholesterol (Calc): 101 mg/dL (calc) — ABNORMAL HIGH
Non-HDL Cholesterol (Calc): 116 mg/dL (calc) (ref <130)
Total CHOL/HDL Ratio: 4.2 (calc) (ref <5.0)
Triglycerides: 63 mg/dL (ref <150)

## 2017-11-04 LAB — TSH: TSH: 1.75 m[IU]/L

## 2017-11-04 LAB — VITAMIN D 25 HYDROXY (VIT D DEFICIENCY, FRACTURES): VIT D 25 HYDROXY: 30 ng/mL (ref 30–100)

## 2017-11-18 ENCOUNTER — Encounter: Payer: Self-pay | Admitting: Family Medicine

## 2018-01-11 ENCOUNTER — Ambulatory Visit
Admission: RE | Admit: 2018-01-11 | Discharge: 2018-01-11 | Disposition: A | Discharge status: Home / Self Care | Payer: BLUE CROSS/BLUE SHIELD | Attending: Nurse Practitioner | Admitting: Nurse Practitioner

## 2018-01-11 ENCOUNTER — Encounter: Payer: Self-pay | Admitting: Nurse Practitioner

## 2018-01-11 ENCOUNTER — Ambulatory Visit: Payer: BLUE CROSS/BLUE SHIELD | Admitting: Nurse Practitioner

## 2018-01-11 ENCOUNTER — Ambulatory Visit
Admission: RE | Admit: 2018-01-11 | Discharge: 2018-01-11 | Disposition: A | Discharge status: Home / Self Care | Payer: BLUE CROSS/BLUE SHIELD | Source: Ambulatory Visit | Attending: Nurse Practitioner | Admitting: Nurse Practitioner

## 2018-01-11 VITALS — BP 110/72 | HR 74 | Temp 98.0°F | Resp 18 | Ht 66.0 in | Wt 227.6 lb

## 2018-01-11 DIAGNOSIS — R059 Cough, unspecified: Secondary | ICD-10-CM

## 2018-01-11 DIAGNOSIS — R05 Cough: Secondary | ICD-10-CM

## 2018-01-11 DIAGNOSIS — R0602 Shortness of breath: Secondary | ICD-10-CM

## 2018-01-11 MED ORDER — BENZONATATE 100 MG PO CAPS: 200.0000 mg | ORAL_CAPSULE | Freq: Two times a day (BID) | ORAL | 0 refills | Status: DC | PRN

## 2018-01-11 NOTE — Progress Notes (Signed)
Name: Andrea Jordan   MRN: [id number]    DOB: [birth year]   Date:01/11/2018       Progress Note  Subjective  Chief Complaint  Chief Complaint  Patient presents with  . Cough    some productive - clear. OTC - Mucinex  . Sore Throat  . Ear Fullness    bilateral - lots of pressure  . Shortness of Breath    some tightness in chest    HPI  Patient endorses 2 weeks of cough and sore throat, states last week started to get ear fullness and hoarse voice and is now coughing up phlegm. Endorses exertional  shortness of breath and some intermittent chest tightness when she exerts herself. 2 girls near her have had pneumonia.   Patient Active Problem List   Diagnosis Date Noted  . Mass of upper outer quadrant of left breast 12/03/2016  . Vitamin D deficiency 07/28/2016  . Dyslipidemia 07/28/2016  . Insulin resistance 07/28/2016  . Numerous moles 07/28/2016  . Fatty liver 05/04/2016  . Obesity, Class III, BMI 40-49.9 (morbid obesity) ([place]) 04/20/2016  . PCOS (polycystic ovarian syndrome) 04/20/2016    Past Medical History:  Diagnosis Date  . Anemia   . Infertility, female   . Insulin resistance   . LGSIL on Pap smear of cervix 08/16/2008   lgsil; pos  . Obesity   . Papanicolaou smear of cervix with atypical squamous cells of undetermined significance (ASC-US) 01/06/2012   [place]; neg hpv    Past Surgical History:  Procedure Laterality Date  . CHOLECYSTECTOMY, LAPAROSCOPIC  11/18/2011   DR. BYRD  . COLPOSCOPY  2010   [facility] I  . DILATION AND CURETTAGE OF UTERUS  10/04/2008  . LEEP  10/04/2008   cervical; [facility]    Social History   Tobacco Use  . Smoking status: Never Smoker  . Smokeless tobacco: Never Used  Substance Use Topics  . Alcohol use: Yes    Comment: rarely     Current Outpatient Medications:  .  levonorgestrel (LILETTA, 52 MG,) 18.6 MCG/DAY IUD IUD, 1 each by Intrauterine route once. 10/2014, Disp: , Rfl:  .  benzonatate (TESSALON PERLES) 100 MG  capsule, Take 2 capsules (200 mg total) by mouth 2 (two) times daily as needed for cough., Disp: 30 capsule, Rfl: 0  Allergies  Allergen Reactions  . Clams [Shellfish Allergy] Hives    ROS   No other specific complaints in a complete review of systems (except as listed in HPI above).  Objective  Vitals:   01/11/18 0934 01/11/18 0942  BP: 110/72   Pulse: (!) 102 74  Resp: 16 18  Temp: 98 F (36.7 C)   TempSrc: Oral   SpO2: 99% 99%  Weight: 227 lb 9.6 oz (103.2 kg)   Height: 5\' 6"  (1.676 m)     Body mass index is 36.74 kg/m.  Nursing Note and Vital Signs reviewed.  Physical Exam HENT:     Head: Normocephalic and atraumatic.     Right Ear: Tympanic membrane, ear canal and external ear normal. No tenderness.     Left Ear: Tympanic membrane, ear canal and external ear normal. No tenderness.     Nose: Nose normal.     Mouth/Throat:     Mouth: Mucous membranes are moist.     Pharynx: No oropharyngeal exudate or posterior oropharyngeal erythema.     Tonsils: No tonsillar exudate.  Eyes:     General:        Right eye:  No discharge.        Left eye: No discharge.     Conjunctiva/sclera: Conjunctivae normal.  Neck:     Musculoskeletal: Normal range of motion.  Cardiovascular:     Rate and Rhythm: Normal rate and regular rhythm.  Pulmonary:     Effort: Pulmonary effort is normal.     Breath sounds: Normal breath sounds. No wheezing or rhonchi.  Abdominal:     Palpations: Abdomen is soft.     Tenderness: There is no abdominal tenderness.  Lymphadenopathy:     Cervical: No cervical adenopathy.  Skin:    General: Skin is warm and dry.     Findings: No rash.  Neurological:     Mental Status: She is alert.  Psychiatric:        Judgment: Judgment normal.       No results found for this or any previous visit (from the past 48 hour(s)).  Assessment & Plan 1. Cough Discussed OTC treatments- due to being young and healthy consider walking pneumonia due to  symptomology despite patients physical exam benign. Initially planned to get chest xray if symptoms not improving but with positive contacts and patients increased concern will complete today.  - DG Chest 2 View; Future - benzonatate (TESSALON PERLES) 100 MG capsule; Take 2 capsules (200 mg total) by mouth 2 (two) times daily as needed for cough.  Dispense: 30 capsule; Refill: 0  2. Shortness of breath - DG Chest 2 View; Future

## 2018-01-11 NOTE — Patient Instructions (Signed)
-   Please go across the street to the imaging center to get a chest xray   You likely have a viral upper respiratory infection (URI). Antibiotics will not reduce the number of days you are ill or prevent you from getting bacterial rhinosinusitis. A URI can take up to 14 days to resolve, but typically last between 7-11 days. Your body is so smart and strong that it will be fighting this illness off for you but it is important that you drink plenty of fluids, rest. Cover your nose/mouth when you cough or sneeze and wash your hands well and often. Here are some helpful things you can use or pick up over the counter from the pharmacy to help with your symptoms:   For Fever/Pain: Acetaminophen every 6 hours as needed (maximum of 3000mg  a day). If you are still uncomfortable you can add ibuprofen OR naproxen  For coughing: try dextromethorphan for a cough suppressant, and/or a cool mist humidifier, lozenges  For sore throat: saline gargles, honey herbal tea, lozenges, throat spray  To dry out your nose: try an antihistamine like loratadine (non-sedating) or diphenhydramine (sedating) or others To relieve a stuffy nose: try an oral decongestant  Like pseudoephedrine if you are under the age of 9 and do not have high blood pressure, neti pot To make blowing your nose easier: guaifenesin

## 2018-02-14 IMAGING — US US ABDOMEN LIMITED
1 series · 14 of 25 positions shown · non-contrast
Comparison: None

CLINICAL DATA: Right upper quadrant pain

EXAM:
US ABDOMEN LIMITED - RIGHT UPPER QUADRANT

[Series 1: us abdomen limited · 0.25mm/px · 14 of 52 slices shown]
[im 1/52]
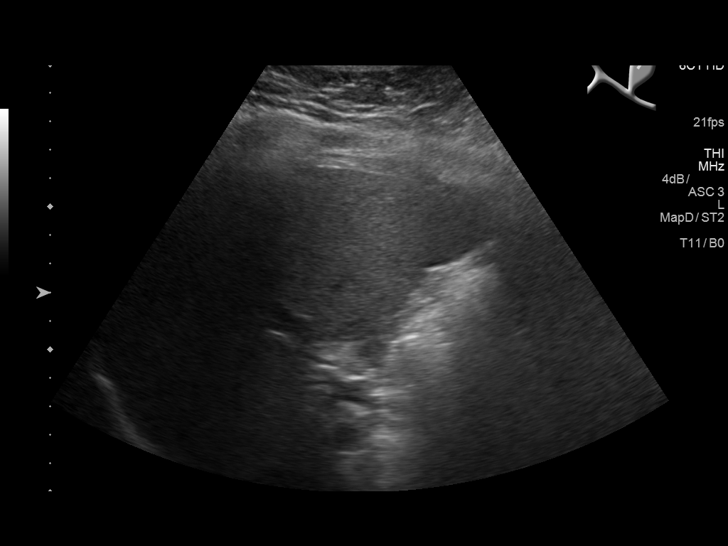
[im 5/52]
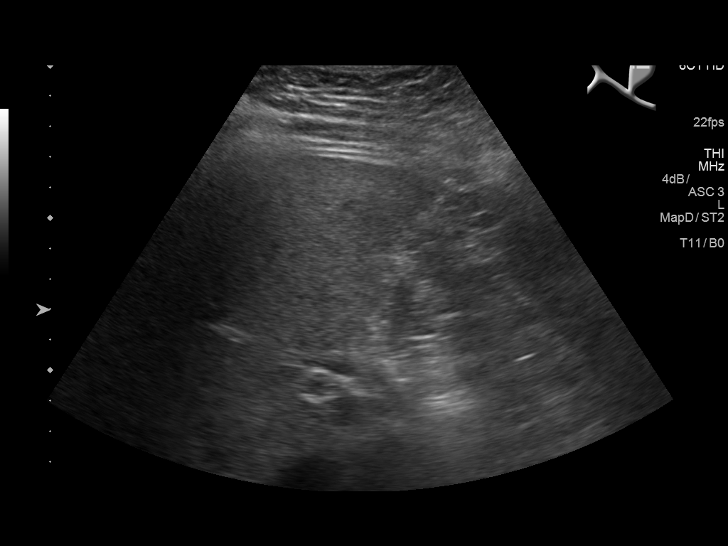
[im 9/52]
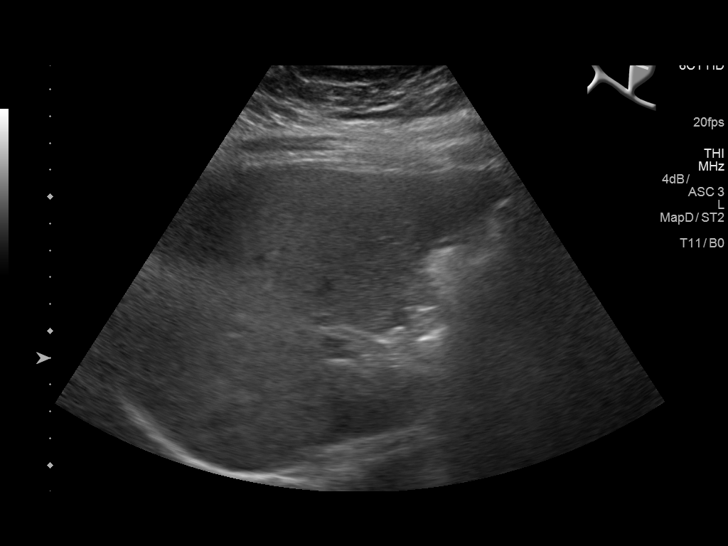
[im 13/52]
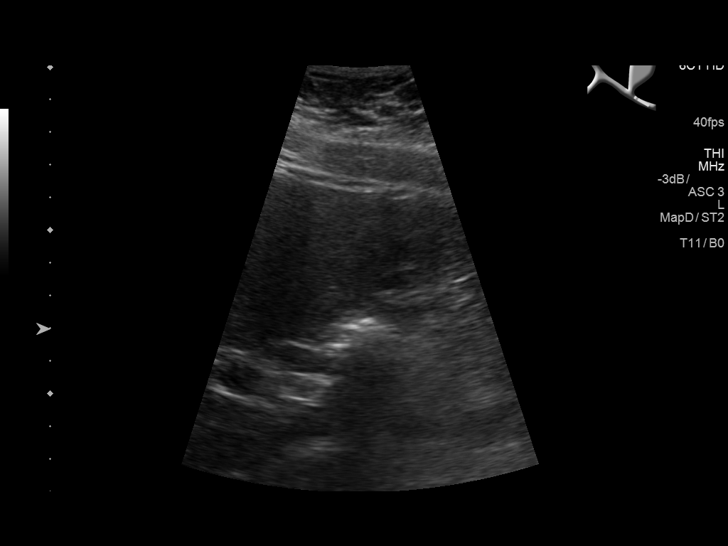
[im 18/52]
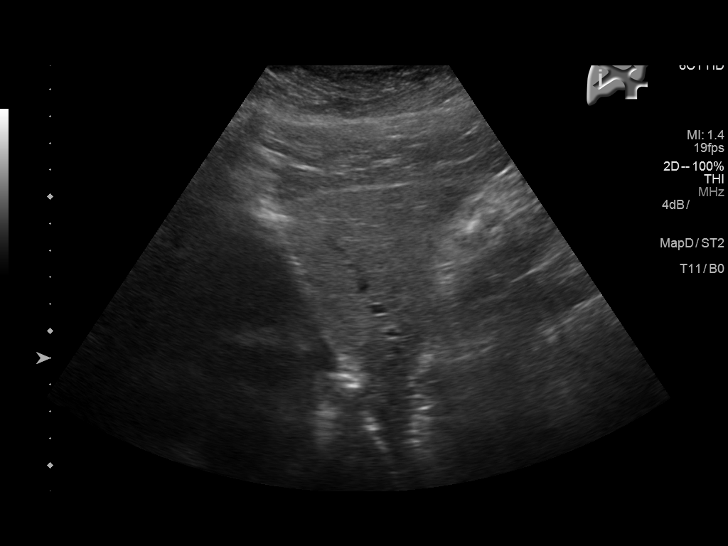
[im 20/52]
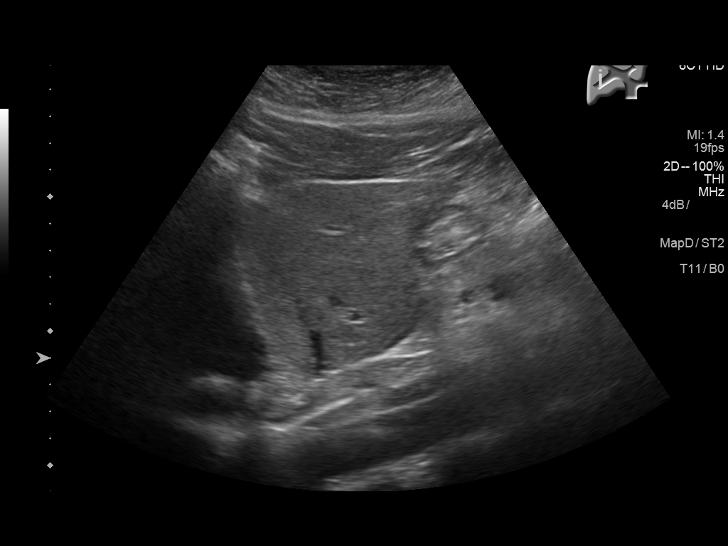
[im 24/52]
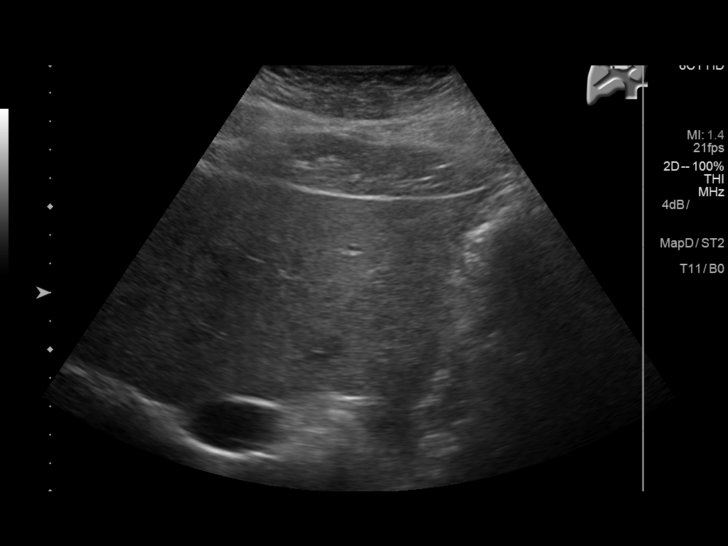
[im 28/52]
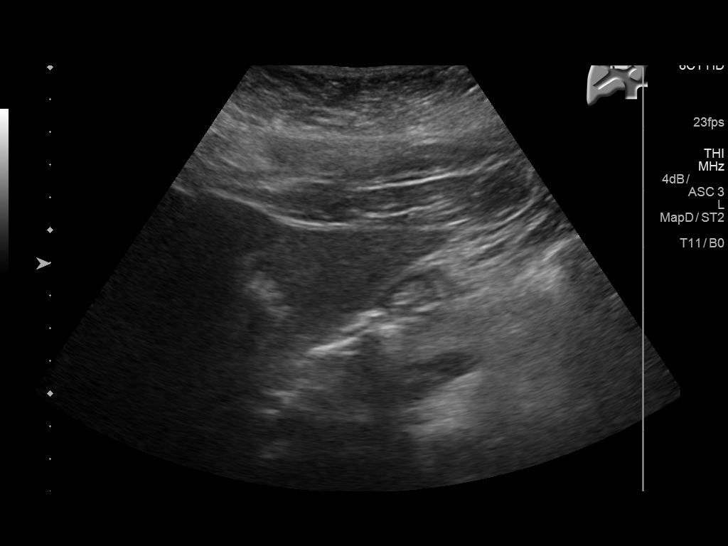
[im 32/52]
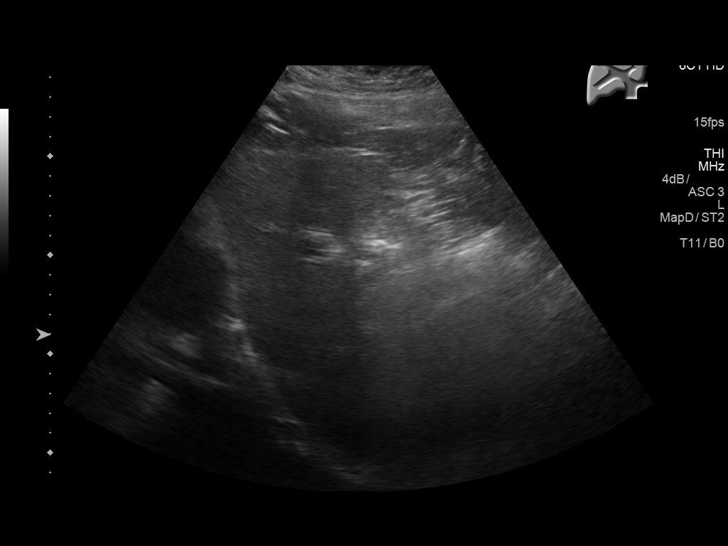
[im 35/52]
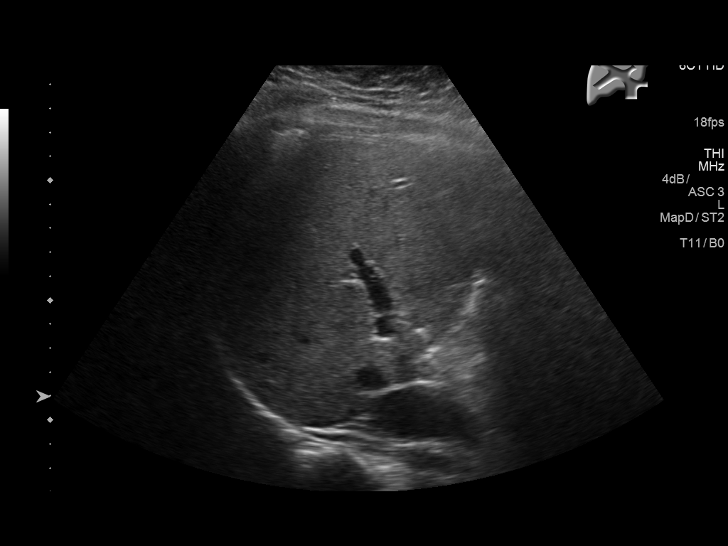
[im 39/52]
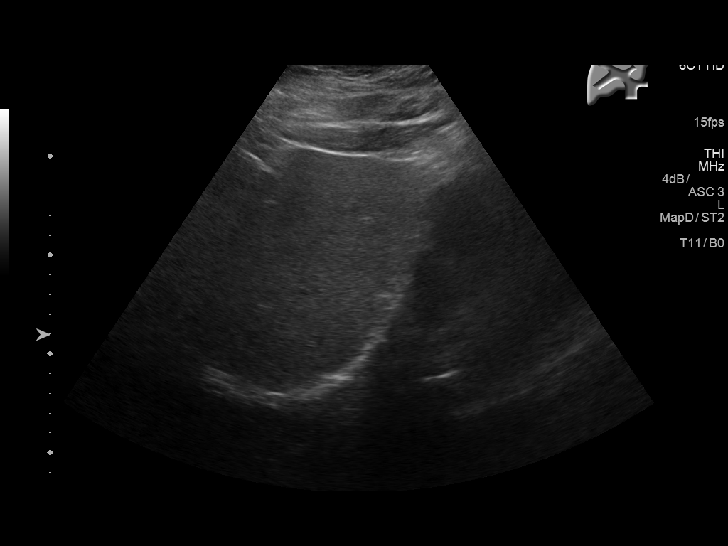
[im 43/52]
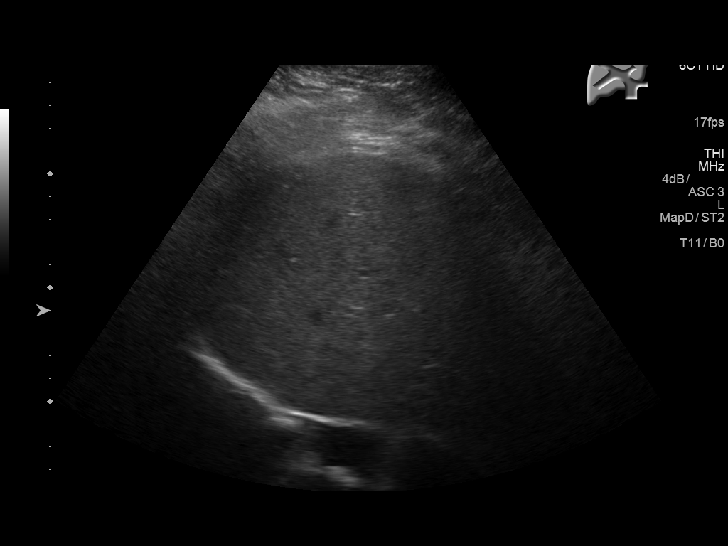
[im 47/52]
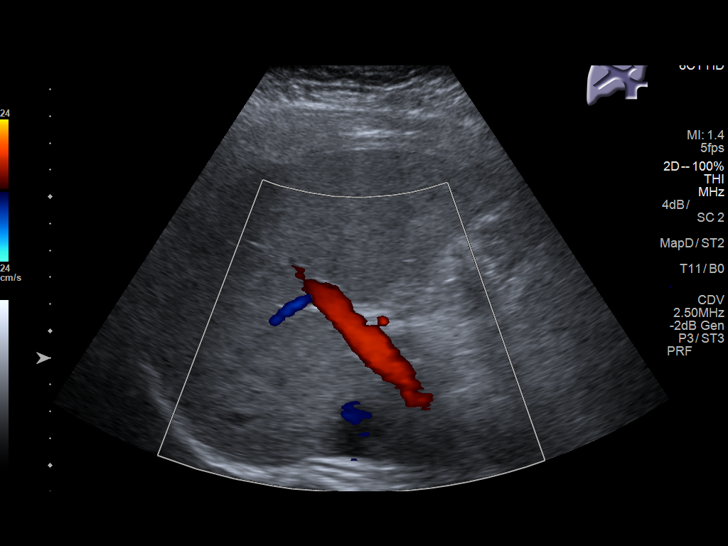
[im 52/52]
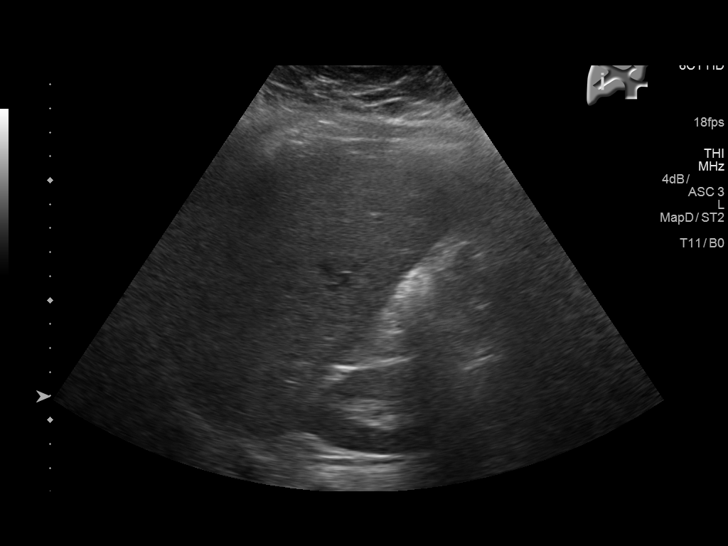

[14 of 25 positions shown; findings below may reference images not displayed]

FINDINGS: Gallbladder:

Surgically removed.

Common bile duct:

Diameter: 7 mm

Liver:

Increased in echogenicity consistent with fatty infiltration.
IMPRESSION: Fatty liver.

No acute abnormality noted.

## 2018-06-02 DIAGNOSIS — Z86018 Personal history of other benign neoplasm: Secondary | ICD-10-CM | POA: Diagnosis not present

## 2018-06-02 DIAGNOSIS — L578 Other skin changes due to chronic exposure to nonionizing radiation: Secondary | ICD-10-CM | POA: Diagnosis not present

## 2018-06-02 DIAGNOSIS — D225 Melanocytic nevi of trunk: Secondary | ICD-10-CM | POA: Diagnosis not present

## 2018-06-21 DIAGNOSIS — M5386 Other specified dorsopathies, lumbar region: Secondary | ICD-10-CM | POA: Diagnosis not present

## 2018-06-21 DIAGNOSIS — M530 Cervicocranial syndrome: Secondary | ICD-10-CM | POA: Diagnosis not present

## 2018-06-21 DIAGNOSIS — M531 Cervicobrachial syndrome: Secondary | ICD-10-CM | POA: Diagnosis not present

## 2018-06-21 DIAGNOSIS — M9902 Segmental and somatic dysfunction of thoracic region: Secondary | ICD-10-CM | POA: Diagnosis not present

## 2018-09-01 IMAGING — US US BREAST*L* LIMITED INC AXILLA
1 series · 3 of 3 positions shown · non-contrast
Comparison: Previous exam(s).

CLINICAL DATA: 30-year-old female with palpable left breast
abnormality for 1 month.

EXAM:
2D DIGITAL DIAGNOSTIC BILATERAL MAMMOGRAM WITH CAD AND ADJUNCT TOMO
ULTRASOUND LEFT BREAST

[Series 1: us breast*left* limited inc axilla · 0.09mm/px · 3 of 3 slices shown]
[im 1/3]
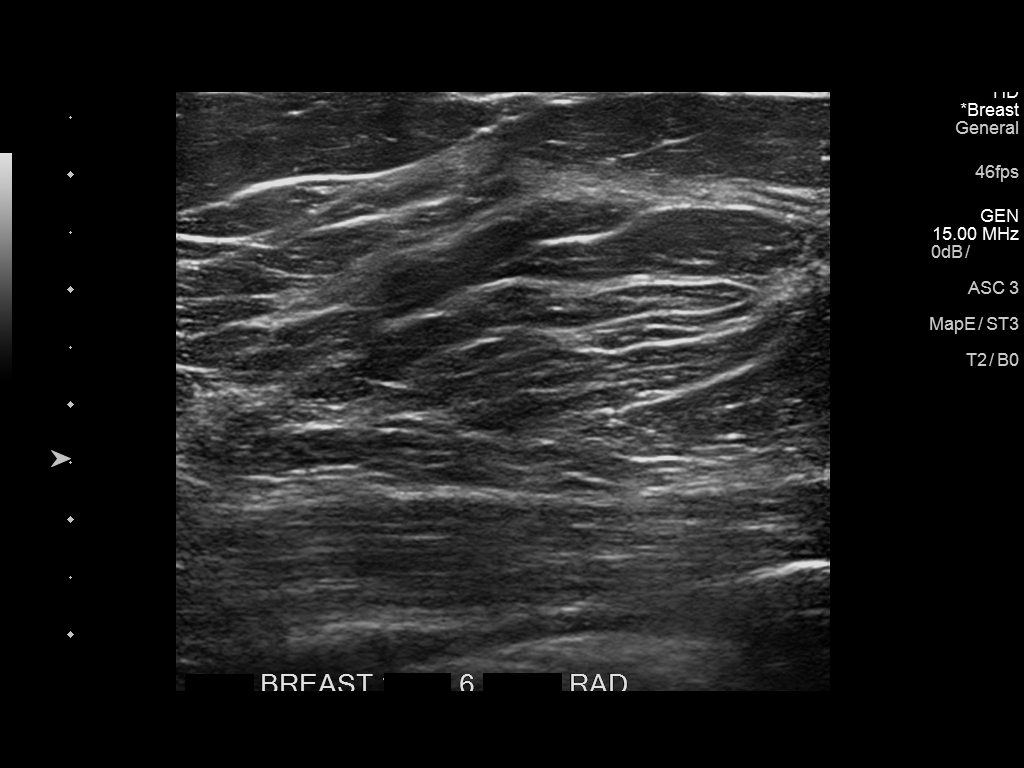
[im 2/3]
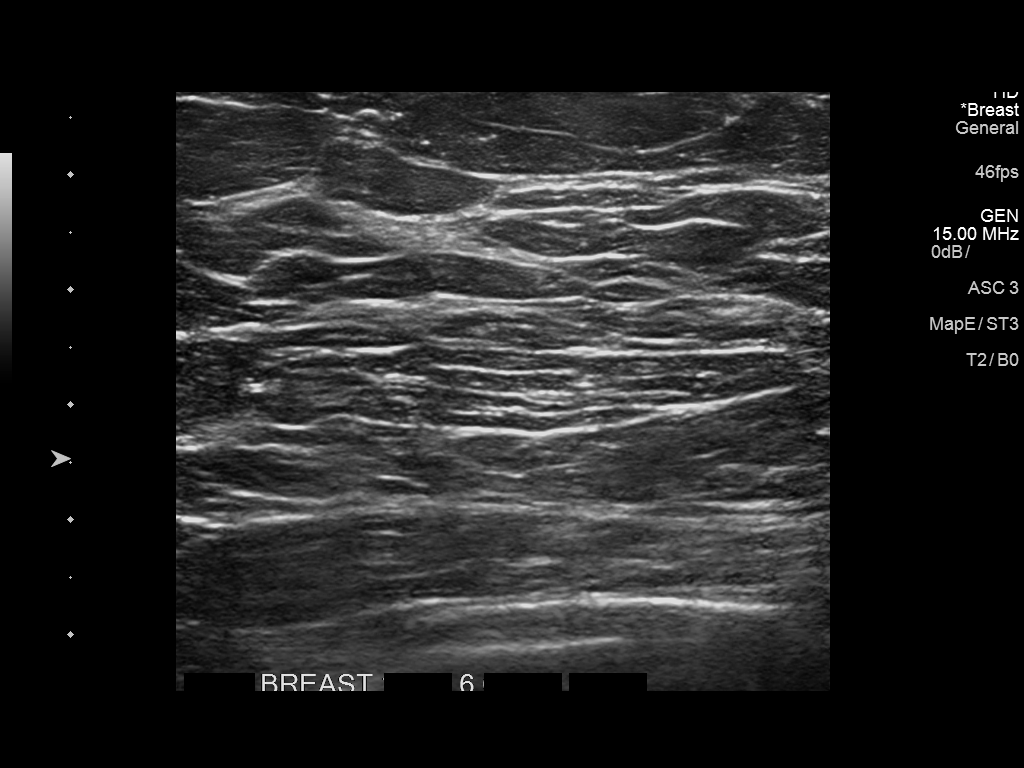
[im 3/3]
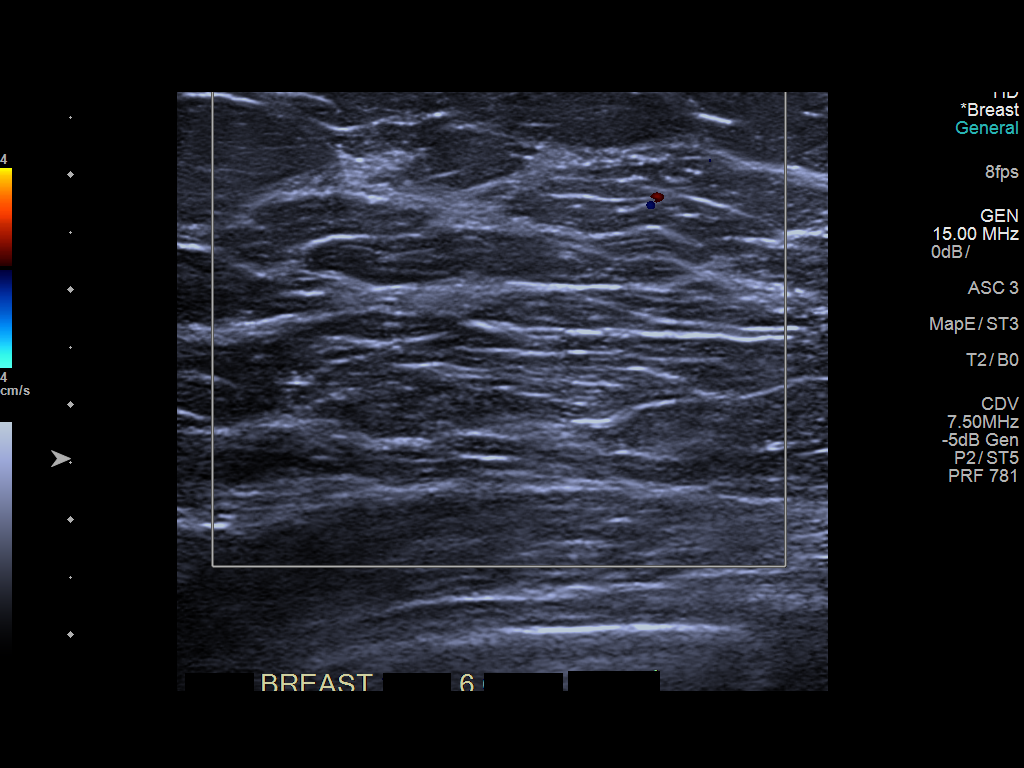

[3 of 3 positions shown; findings below may reference images not displayed]

ACR Breast Density Category b: There are scattered areas of
fibroglandular density.
FINDINGS: A radiopaque BB is placed at the site of the patient's palpable
abnormality in the superior central left breast anteriorly. No
mammographic findings are seen deep to the radiopaque BB.

No suspicious masses or calcifications are identified in either
breast.

Mammographic images were processed with CAD.

On physical exam, I palpated an area of focal thickening in the
superior central breast 4-5 cm from the nipple.

Targeted ultrasound is performed, showing normal fibroglandular
tissue in the region of the patient's palpable abnormality. No
masses, cystic lesions or focal sonographic abnormalities are
identified.
IMPRESSION: Normal fibroglandular tissue likely accounting for the patient's
palpable abnormality. No suspicious mammographic or sonographic
findings are identified.

RECOMMENDATION:
1. Screening mammogram at age 40 unless there are persistent or
intervening clinical concerns. (Code:[id number]-X-HJ7)
2. Clinical follow-up recommended for the palpable area of concern
in the left breast. Any further workup should be based on clinical
grounds.

I have discussed the findings and recommendations with the patient.
Results were also provided in writing at the conclusion of the
visit. If applicable, a reminder letter will be sent to the patient
regarding the next appointment.

BI-RADS CATEGORY  1: Negative.

## 2018-10-06 ENCOUNTER — Other Ambulatory Visit: Payer: Self-pay | Admitting: Family Medicine

## 2018-10-06 ENCOUNTER — Encounter: Payer: Self-pay | Admitting: Family Medicine

## 2018-10-06 DIAGNOSIS — R5383 Other fatigue: Secondary | ICD-10-CM

## 2018-10-06 DIAGNOSIS — E8881 Metabolic syndrome: Secondary | ICD-10-CM

## 2018-10-06 DIAGNOSIS — E785 Hyperlipidemia, unspecified: Secondary | ICD-10-CM

## 2018-10-06 DIAGNOSIS — Z131 Encounter for screening for diabetes mellitus: Secondary | ICD-10-CM

## 2018-10-06 DIAGNOSIS — E559 Vitamin D deficiency, unspecified: Secondary | ICD-10-CM

## 2018-10-06 DIAGNOSIS — K760 Fatty (change of) liver, not elsewhere classified: Secondary | ICD-10-CM

## 2018-10-07 DIAGNOSIS — R5383 Other fatigue: Secondary | ICD-10-CM | POA: Diagnosis not present

## 2018-10-07 DIAGNOSIS — E559 Vitamin D deficiency, unspecified: Secondary | ICD-10-CM | POA: Diagnosis not present

## 2018-10-07 DIAGNOSIS — Z131 Encounter for screening for diabetes mellitus: Secondary | ICD-10-CM | POA: Diagnosis not present

## 2018-10-07 DIAGNOSIS — E8881 Metabolic syndrome: Secondary | ICD-10-CM | POA: Diagnosis not present

## 2018-10-08 LAB — THYROID PANEL WITH TSH
Free Thyroxine Index: 1.9 (ref 1.4–3.8)
T3 Uptake: 32 % (ref 22–35)
T4, Total: 6 ug/dL (ref 5.1–11.9)
TSH: 1.55 mIU/L

## 2018-10-08 LAB — CBC WITH DIFFERENTIAL/PLATELET
Absolute Monocytes: 323 {cells}/uL (ref 200–950)
Basophils Absolute: 22 {cells}/uL (ref 0–200)
Basophils Relative: 0.5 %
Eosinophils Absolute: 52 {cells}/uL (ref 15–500)
Eosinophils Relative: 1.2 %
HCT: 36.5 % (ref 35.0–45.0)
Hemoglobin: 12.2 g/dL (ref 11.7–15.5)
Lymphs Abs: 2245 {cells}/uL (ref 850–3900)
MCH: 30.2 pg (ref 27.0–33.0)
MCHC: 33.4 g/dL (ref 32.0–36.0)
MCV: 90.3 fL (ref 80.0–100.0)
MPV: 11.3 fL (ref 7.5–12.5)
Monocytes Relative: 7.5 %
Neutro Abs: 1660 {cells}/uL (ref 1500–7800)
Neutrophils Relative %: 38.6 %
Platelets: 187 10*3/uL (ref 140–400)
RBC: 4.04 Million/uL (ref 3.80–5.10)
RDW: 11.9 % (ref 11.0–15.0)
Total Lymphocyte: 52.2 %
WBC: 4.3 10*3/uL (ref 3.8–10.8)

## 2018-10-08 LAB — SEDIMENTATION RATE: Sed Rate: 9 mm/h (ref 0–20)

## 2018-10-08 LAB — COMPLETE METABOLIC PANEL WITHOUT GFR
AG Ratio: 1.8 (calc) (ref 1.0–2.5)
ALT: 16 U/L (ref 6–29)
AST: 15 U/L (ref 10–30)
Albumin: 4.2 g/dL (ref 3.6–5.1)
Alkaline phosphatase (APISO): 59 U/L (ref 31–125)
BUN: 22 mg/dL (ref 7–25)
CO2: 28 mmol/L (ref 20–32)
Calcium: 9.2 mg/dL (ref 8.6–10.2)
Chloride: 106 mmol/L (ref 98–110)
Creat: 0.99 mg/dL (ref 0.50–1.10)
GFR, Est African American: 87 mL/min/{1.73_m2}
GFR, Est Non African American: 75 mL/min/{1.73_m2}
Globulin: 2.3 g/dL (ref 1.9–3.7)
Glucose, Bld: 84 mg/dL (ref 65–99)
Potassium: 4.6 mmol/L (ref 3.5–5.3)
Sodium: 140 mmol/L (ref 135–146)
Total Bilirubin: 0.4 mg/dL (ref 0.2–1.2)
Total Protein: 6.5 g/dL (ref 6.1–8.1)

## 2018-10-08 LAB — LIPID PANEL
Cholesterol: 147 mg/dL (ref <200)
HDL: 40 mg/dL — ABNORMAL LOW (ref >=50)
LDL Cholesterol (Calc): 94 mg/dL (calc)
Non-HDL Cholesterol (Calc): 107 mg/dL (calc) (ref <130)
Total CHOL/HDL Ratio: 3.7 (calc) (ref <5.0)
Triglycerides: 52 mg/dL (ref <150)

## 2018-10-08 LAB — HEMOGLOBIN A1C
Hgb A1c MFr Bld: 5.1 % of total Hgb (ref <5.7)
Mean Plasma Glucose: 100 (calc)
eAG (mmol/L): 5.5 (calc)

## 2018-10-08 LAB — VITAMIN B12: Vitamin B-12: 281 pg/mL (ref 200–1100)

## 2018-10-08 LAB — C-REACTIVE PROTEIN: CRP: 1.2 mg/L

## 2018-10-08 LAB — VITAMIN D 25 HYDROXY (VIT D DEFICIENCY, FRACTURES): Vit D, 25-Hydroxy: 28 ng/mL — ABNORMAL LOW (ref 30–100)

## 2018-11-08 ENCOUNTER — Other Ambulatory Visit (HOSPITAL_COMMUNITY)
Admission: RE | Admit: 2018-11-08 | Discharge: 2018-11-08 | Disposition: A | Discharge status: Home / Self Care | Payer: BC Managed Care – PPO | Source: Ambulatory Visit | Attending: Family Medicine | Admitting: Family Medicine

## 2018-11-08 ENCOUNTER — Ambulatory Visit (INDEPENDENT_AMBULATORY_CARE_PROVIDER_SITE_OTHER): Payer: BC Managed Care – PPO | Admitting: Family Medicine

## 2018-11-08 ENCOUNTER — Encounter: Payer: Self-pay | Admitting: Family Medicine

## 2018-11-08 ENCOUNTER — Other Ambulatory Visit: Payer: Self-pay

## 2018-11-08 VITALS — BP 100/70 | HR 81 | Temp 97.3°F | Resp 14 | Ht 63.0 in | Wt 197.2 lb

## 2018-11-08 DIAGNOSIS — E559 Vitamin D deficiency, unspecified: Secondary | ICD-10-CM

## 2018-11-08 DIAGNOSIS — E8881 Metabolic syndrome: Secondary | ICD-10-CM

## 2018-11-08 DIAGNOSIS — Z23 Encounter for immunization: Secondary | ICD-10-CM

## 2018-11-08 DIAGNOSIS — Z124 Encounter for screening for malignant neoplasm of cervix: Secondary | ICD-10-CM | POA: Insufficient documentation

## 2018-11-08 DIAGNOSIS — N852 Hypertrophy of uterus: Secondary | ICD-10-CM

## 2018-11-08 DIAGNOSIS — Z0000 Encounter for general adult medical examination without abnormal findings: Secondary | ICD-10-CM | POA: Insufficient documentation

## 2018-11-08 DIAGNOSIS — E538 Deficiency of other specified B group vitamins: Secondary | ICD-10-CM

## 2018-11-08 DIAGNOSIS — N6322 Unspecified lump in the left breast, upper inner quadrant: Secondary | ICD-10-CM

## 2018-11-08 DIAGNOSIS — Z131 Encounter for screening for diabetes mellitus: Secondary | ICD-10-CM | POA: Diagnosis not present

## 2018-11-08 DIAGNOSIS — E785 Hyperlipidemia, unspecified: Secondary | ICD-10-CM

## 2018-11-08 NOTE — Patient Instructions (Signed)
Preventive Care 21-34 Years Old, Female Preventive care refers to visits with your health care provider and lifestyle choices that can promote health and wellness. This includes:  A yearly physical exam. This may also be called an annual well check.  Regular dental visits and eye exams.  Immunizations.  Screening for certain conditions.  Healthy lifestyle choices, such as eating a healthy diet, getting regular exercise, not using drugs or products that contain nicotine and tobacco, and limiting alcohol use. What can I expect for my preventive care visit? Physical exam Your health care provider will check your:  Height and weight. This may be used to calculate body mass index (BMI), which tells if you are at a healthy weight.  Heart rate and blood pressure.  Skin for abnormal spots. Counseling Your health care provider may ask you questions about your:  Alcohol, tobacco, and drug use.  Emotional well-being.  Home and relationship well-being.  Sexual activity.  Eating habits.  Work and work environment.  Method of birth control.  Menstrual cycle.  Pregnancy history. What immunizations do I need?  Influenza (flu) vaccine  This is recommended every year. Tetanus, diphtheria, and pertussis (Tdap) vaccine  You may need a Td booster every 10 years. Varicella (chickenpox) vaccine  You may need this if you have not been vaccinated. Human papillomavirus (HPV) vaccine  If recommended by your health care provider, you may need three doses over 6 months. Measles, mumps, and rubella (MMR) vaccine  You may need at least one dose of MMR. You may also need a second dose. Meningococcal conjugate (MenACWY) vaccine  One dose is recommended if you are age 19-21 years and a first-year college student living in a residence hall, or if you have one of several medical conditions. You may also need additional booster doses. Pneumococcal conjugate (PCV13) vaccine  You may need  this if you have certain conditions and were not previously vaccinated. Pneumococcal polysaccharide (PPSV23) vaccine  You may need one or two doses if you smoke cigarettes or if you have certain conditions. Hepatitis A vaccine  You may need this if you have certain conditions or if you travel or work in places where you may be exposed to hepatitis A. Hepatitis B vaccine  You may need this if you have certain conditions or if you travel or work in places where you may be exposed to hepatitis B. Haemophilus influenzae type b (Hib) vaccine  You may need this if you have certain conditions. You may receive vaccines as individual doses or as more than one vaccine together in one shot (combination vaccines). Talk with your health care provider about the risks and benefits of combination vaccines. What tests do I need?  Blood tests  Lipid and cholesterol levels. These may be checked every 5 years starting at age 20.  Hepatitis C test.  Hepatitis B test. Screening  Diabetes screening. This is done by checking your blood sugar (glucose) after you have not eaten for a while (fasting).  Sexually transmitted disease (STD) testing.  BRCA-related cancer screening. This may be done if you have a family history of breast, ovarian, tubal, or peritoneal cancers.  Pelvic exam and Pap test. This may be done every 3 years starting at age 21. Starting at age 30, this may be done every 5 years if you have a Pap test in combination with an HPV test. Talk with your health care provider about your test results, treatment options, and if necessary, the need for more tests.   Follow these instructions at home: Eating and drinking   Eat a diet that includes fresh fruits and vegetables, whole grains, lean protein, and low-fat dairy.  Take vitamin and mineral supplements as recommended by your health care provider.  Do not drink alcohol if: ? Your health care provider tells you not to drink. ? You are  pregnant, may be pregnant, or are planning to become pregnant.  If you drink alcohol: ? Limit how much you have to 0-1 drink a day. ? Be aware of how much alcohol is in your drink. In the [place] one drink equals one 12 oz bottle of beer (355 mL), one 5 oz glass of wine (148 mL), or one 1 oz glass of hard liquor (44 mL). Lifestyle  Take daily care of your teeth and gums.  Stay active. Exercise for at least 30 minutes on 5 or more days each week.  Do not use any products that contain nicotine or tobacco, such as cigarettes, e-cigarettes, and chewing tobacco. If you need help quitting, ask your health care provider.  If you are sexually active, practice safe sex. Use a condom or other form of birth control (contraception) in order to prevent pregnancy and STIs (sexually transmitted infections). If you plan to become pregnant, see your health care provider for a preconception visit. What's next?  Visit your health care provider once a year for a well check visit.  Ask your health care provider how often you should have your eyes and teeth checked.  Stay up to date on all vaccines. This information is not intended to replace advice given to you by your health care provider. Make sure you discuss any questions you have with your health care provider. Document Released: 03/10/2001 Document Revised: 09/23/2017 Document Reviewed: 09/23/2017 Elsevier Patient Education  2020 [employer]  

## 2018-11-08 NOTE — Progress Notes (Signed)
Name: Andrea Jordan   MRN: [id number]    DOB: 12/20/1985   Date:11/08/2018       Progress Note  Subjective  Chief Complaint  Chief Complaint  Patient presents with  . Annual Exam    HPI  Patient presents for annual CPE.  Diet: she is seeing a certified nutritionist for the past 18 months, shops on Saturday , preps on Sunday . Exercise: she is still running, going to the gym, weight training.   USPSTF grade A and B recommendations    Office Visit from 11/08/2018 in [facility]  AUDIT-C Score  0     Depression: Phq 9 is  negative Depression screen [facility] 2/9 11/08/2018 01/11/2018 11/02/2017 04/29/2017 07/28/2016  Decreased Interest 0 0 0 0 0  Down, Depressed, Hopeless 0 0 0 0 0  PHQ - 2 Score 0 0 0 0 0  Altered sleeping 0 0 0 0 -  Tired, decreased energy 0 0 0 1 -  Change in appetite 0 0 0 1 -  Feeling bad or failure about yourself  0 0 0 0 -  Trouble concentrating 0 0 0 0 -  Moving slowly or fidgety/restless 0 0 0 0 -  Suicidal thoughts 0 0 0 0 -  PHQ-9 Score 0 0 0 2 -  Difficult doing work/chores Not difficult at all Not difficult at all Not difficult at all Not difficult at all -   Hypertension: BP Readings from Last 3 Encounters:  11/08/18 100/70  01/11/18 110/72  11/02/17 112/76   Obesity: Wt Readings from Last 3 Encounters:  11/08/18 197 lb 3.2 oz (89.4 kg)  01/11/18 227 lb 9.6 oz (103.2 kg)  11/02/17 232 lb (105.2 kg)   BMI Readings from Last 3 Encounters:  11/08/18 34.93 kg/m  01/11/18 36.74 kg/m  11/02/17 37.45 kg/m     Hep C Screening: 10/2017  STD testing and prevention (HIV/chl/gon/syphilis): not interested  Intimate partner violence: negative screen  Sexual History/Pain during Intercourse: no pain, she also has mild post-coital bleeding - but always present  Menstrual History/LMP/Abnormal Bleeding: regular cycles Incontinence Symptoms: no problems  Breast cancer:  - Last Mammogram: 2018 for palpable lump  - BRCA gene  screening: N/A  Osteoporosis Screening: discussed high calcium and vitamin D diet   Cervical cancer screening: up to date  Skin cancer: family history of melanoma, reminded her to have yearly dermatologist follow up Colorectal cancer: start at age 65   Advanced Care Planning: A voluntary discussion about advance care planning including the explanation and discussion of advance directives.  Discussed health care proxy and Living will, and the patient was able to identify a health care proxy as husband .  Patient does have a living will at present time. If patient does have living will, I have requested they bring this to the clinic to be scanned in to their chart.  Lipids: Lab Results  Component Value Date   CHOL 147 10/07/2018   CHOL 152 11/02/2017   CHOL 161 10/29/2016   Lab Results  Component Value Date   HDL 40 (L) 10/07/2018   HDL 36 (L) 11/02/2017   HDL 45 (L) 10/29/2016   Lab Results  Component Value Date   LDLCALC 94 10/07/2018   LDLCALC 101 (H) 11/02/2017   LDLCALC 101 (H) 10/29/2016   Lab Results  Component Value Date   TRIG 52 10/07/2018   TRIG 63 11/02/2017   TRIG 63 10/29/2016   Lab Results  Component Value  Date   CHOLHDL 3.7 10/07/2018   CHOLHDL 4.2 11/02/2017   CHOLHDL 3.6 10/29/2016   No results found for: LDLDIRECT  Glucose: Glucose  Date Value Ref Range Status  10/26/2011 119 (H) 65 - 99 mg/dL Final   Glucose, Bld  Date Value Ref Range Status  10/07/2018 84 65 - 99 mg/dL Final    Comment:    .            Fasting reference interval .   11/02/2017 85 65 - 99 mg/dL Final    Comment:    .            Fasting reference interval .   10/29/2016 89 65 - 99 mg/dL Final    Comment:    .            Fasting reference interval .     Patient Active Problem List   Diagnosis Date Noted  . Mass of upper outer quadrant of left breast 12/03/2016  . Vitamin D deficiency 07/28/2016  . Dyslipidemia 07/28/2016  . Insulin resistance 07/28/2016  .  Numerous moles 07/28/2016  . Fatty liver 05/04/2016  . Obesity, Class III, BMI 40-49.9 (morbid obesity) ([place]) 04/20/2016  . PCOS (polycystic ovarian syndrome) 04/20/2016    Past Surgical History:  Procedure Laterality Date  . CHOLECYSTECTOMY, LAPAROSCOPIC  11/18/2011   DR. BYRD  . COLPOSCOPY  2010   CIN I  . DILATION AND CURETTAGE OF UTERUS  10/04/2008  . LEEP  10/04/2008   cervical; PJR    Family History  Problem Relation Age of Onset  . Brain cancer Mother        hodgkins lymphpoma  . Colon polyps Father   . Diabetes Father   . Heart disease Father   . Hypertension Father   . Colon polyps Sister   . Endometriosis Sister   . Diabetes Paternal Uncle   . Heart disease Paternal Uncle   . Stroke Paternal Uncle   . Diabetes Maternal Grandmother   . Diabetes Paternal Grandmother   . Diabetes Paternal Grandfather   . Breast cancer Neg Hx     Social History   Socioeconomic History  . Marital status: Married    Spouse name: Corene Cornea  . Number of children: 2  . Years of education: 78yrcollege  . Highest education level: Not on file  Occupational History  . Occupation: tEstate agent   Comment: SInsurance underwriter  Social Needs  . Financial resource strain: Not hard at all  . Food insecurity    Worry: Never true    Inability: Never true  . Transportation needs    Medical: No    Non-medical: Not on file  Tobacco Use  . Smoking status: Never Smoker  . Smokeless tobacco: Never Used  Substance and Sexual Activity  . Alcohol use: Yes    Comment: rarely  . Drug use: No  . Sexual activity: Yes    Partners: Male    Birth control/protection: None, I.U.D.    Comment: Liletta  Lifestyle  . Physical activity    Days per week: 5 days    Minutes per session: 60 min  . Stress: Not at all  Relationships  . Social connections    Talks on phone: More than three times a week    Gets together: More than three times a week    Attends religious service: More than 4 times per  year    Active member of club or organization:  Yes    Attends meetings of clubs or organizations: Never    Relationship status: Married  . Intimate partner violence    Fear of current or ex partner: No    Emotionally abused: No    Physically abused: No    Forced sexual activity: No  Other Topics Concern  . Not on file  Social History Narrative   Married, husband had a stroke at age 31 but is back to normal   They have two girls.   Works as a Estate agent at [employer]     Current Outpatient Medications:  .  levonorgestrel (LILETTA, 52 MG,) 18.6 MCG/DAY IUD IUD, 1 each by Intrauterine route once. 10/2014, Disp: , Rfl:  .  Multiple Vitamins-Minerals (WOMENS ONE DAILY PO), Take by mouth., Disp: , Rfl:   Allergies  Allergen Reactions  . Clams [Shellfish Allergy] Hives     ROS  Constitutional: Negative for fever, positive for  weight change.  Respiratory: Negative for cough and shortness of breath.   Cardiovascular: Negative for chest pain or palpitations.  Gastrointestinal: Negative for abdominal pain, no bowel changes.  Musculoskeletal: Negative for gait problem or joint swelling.  Skin: Negative for rash.  Neurological: Negative for dizziness or headache.  No other specific complaints in a complete review of systems (except as listed in HPI above).  Objective  Vitals:   11/08/18 0810  BP: 100/70  Pulse: 81  Resp: 14  Temp: (!) 97.3 F (36.3 C)  SpO2: 96%  Weight: 197 lb 3.2 oz (89.4 kg)  Height: '5\' 3"'$  (1.6 m)    Body mass index is 34.93 kg/m.  Physical Exam  Constitutional: Patient appears well-developed and well-nourished. No distress.  HENT: Head: Normocephalic and atraumatic. Ears: B TMs ok, no erythema or effusion; Nose: Nose normal. Mouth/Throat: not done  Eyes: Conjunctivae and EOM are normal. Pupils are equal, round, and reactive to light. No scleral icterus.  Neck: Normal range of motion. Neck supple. No JVD present. No thyromegaly present.   Cardiovascular: Normal rate, regular rhythm and normal heart sounds.  No murmur heard. No BLE edema. Pulmonary/Chest: Effort normal and breath sounds normal. No respiratory distress. Abdominal: Soft. Bowel sounds are normal, no distension. There is no tenderness. no masses Breast: lumpy bilaterally, and stable marble size lump at left inner upper quadrant, no nipple discharge or rashes FEMALE GENITALIA:  External genitalia normal External urethra normal Vaginal vault normal without discharge or lesions Cervix normal without discharge , IUD string in place, small polyp inside cervix -friable Bimanual exam showed nodular uterus more prominent on left side  RECTAL: no rectal masses or hemorrhoids Musculoskeletal: Normal range of motion, no joint effusions. No gross deformities Neurological: he is alert and oriented to person, place, and time. No cranial nerve deficit. Coordination, balance, strength, speech and gait are normal.  Skin: Skin is warm and dry. No rash noted. No erythema.  Psychiatric: Patient has a normal mood and affect. behavior is normal. Judgment and thought content normal.  Recent Results (from the past 2160 hour(s))  Vitamin B12     Status: None   Collection Time: 10/07/18 12:00 AM  Result Value Ref Range   Vitamin B-12 281 200 - 1,100 pg/mL    Comment: . Please Note: Although the reference range for vitamin B12 is 2033831778 pg/mL, it has been reported that between 5 and 10% of patients with values between 200 and 400 pg/mL may experience neuropsychiatric and hematologic abnormalities due to occult B12 deficiency; less than  1% of patients with values above 400 pg/mL will have symptoms. .   Thyroid Panel With TSH     Status: None   Collection Time: 10/07/18 12:00 AM  Result Value Ref Range   T3 Uptake 32 22 - 35 %   T4, Total 6.0 5.1 - 11.9 mcg/dL   Free Thyroxine Index 1.9 1.4 - 3.8   TSH 1.55 mIU/L    Comment:           Reference Range .           > or = 20  Years  0.40-4.50 .                Pregnancy Ranges           First trimester    0.26-2.66           Second trimester   0.55-2.73           Third trimester    0.43-2.91   VITAMIN D 25 Hydroxy (Vit-D Deficiency, Fractures)     Status: Abnormal   Collection Time: 10/07/18 12:00 AM  Result Value Ref Range   Vit D, 25-Hydroxy 28 (L) 30 - 100 ng/mL    Comment: Vitamin D Status         25-OH Vitamin D: . Deficiency:                    <20 ng/mL Insufficiency:             20 - 29 ng/mL Optimal:                 > or = 30 ng/mL . For 25-OH Vitamin D testing on patients on  D2-supplementation and patients for whom quantitation  of D2 and D3 fractions is required, the QuestAssureD(TM) 25-OH VIT D, (D2,D3), LC/MS/MS is recommended: order  code [phone] (patients >45yr). See Note 1 . Note 1 . For additional information, please refer to  http://education.[facility].com/faq/FAQ199  (This link is being provided for informational/ educational purposes only.)   Hemoglobin A1c     Status: None   Collection Time: 10/07/18 12:00 AM  Result Value Ref Range   Hgb A1c MFr Bld 5.1 <5.7 % of total Hgb    Comment: For the purpose of screening for the presence of diabetes: . <5.7%       Consistent with the absence of diabetes 5.7-6.4%    Consistent with increased risk for diabetes             (prediabetes) > or =6.5%  Consistent with diabetes . This assay result is consistent with a decreased risk of diabetes. . Currently, no consensus exists regarding use of hemoglobin A1c for diagnosis of diabetes in children. . According to American Diabetes Association (ADA) guidelines, hemoglobin A1c <7.0% represents optimal control in non-pregnant diabetic patients. Different metrics may apply to specific patient populations.  Standards of Medical Care in Diabetes(ADA). .    Mean Plasma Glucose 100 (calc)   eAG (mmol/L) 5.5 (calc)  CBC with Differential/Platelet     Status: None   Collection  Time: 10/07/18 12:00 AM  Result Value Ref Range   WBC 4.3 3.8 - 10.8 Thousand/uL   RBC 4.04 3.80 - 5.10 Million/uL   Hemoglobin 12.2 11.7 - 15.5 g/dL   HCT 36.5 35.0 - 45.0 %   MCV 90.3 80.0 - 100.0 fL   MCH 30.2 27.0 - 33.0 pg   MCHC 33.4 32.0 - 36.0 g/dL  RDW 11.9 11.0 - 15.0 %   Platelets 187 140 - 400 Thousand/uL   MPV 11.3 7.5 - 12.5 fL   Neutro Abs 1,660 1,500 - 7,800 cells/uL   Lymphs Abs 2,245 850 - 3,900 cells/uL   Absolute Monocytes 323 200 - 950 cells/uL   Eosinophils Absolute 52 15 - 500 cells/uL   Basophils Absolute 22 0 - 200 cells/uL   Neutrophils Relative % 38.6 %   Total Lymphocyte 52.2 %   Monocytes Relative 7.5 %   Eosinophils Relative 1.2 %   Basophils Relative 0.5 %  COMPLETE METABOLIC PANEL WITH GFR     Status: None   Collection Time: 10/07/18 12:00 AM  Result Value Ref Range   Glucose, Bld 84 65 - 99 mg/dL    Comment: .            Fasting reference interval .    BUN 22 7 - 25 mg/dL   Creat 0.99 0.50 - 1.10 mg/dL   GFR, Est Non African American 75 > OR = 60 mL/min/1.65m   GFR, Est African American 87 > OR = 60 mL/min/1.739m  BUN/Creatinine Ratio NOT APPLICABLE 6 - 22 (calc)   Sodium 140 135 - 146 mmol/L   Potassium 4.6 3.5 - 5.3 mmol/L   Chloride 106 98 - 110 mmol/L   CO2 28 20 - 32 mmol/L   Calcium 9.2 8.6 - 10.2 mg/dL   Total Protein 6.5 6.1 - 8.1 g/dL   Albumin 4.2 3.6 - 5.1 g/dL   Globulin 2.3 1.9 - 3.7 g/dL (calc)   AG Ratio 1.8 1.0 - 2.5 (calc)   Total Bilirubin 0.4 0.2 - 1.2 mg/dL   Alkaline phosphatase (APISO) 59 31 - 125 U/L   AST 15 10 - 30 U/L   ALT 16 6 - 29 U/L  Lipid panel     Status: Abnormal   Collection Time: 10/07/18 12:00 AM  Result Value Ref Range   Cholesterol 147 <200 mg/dL   HDL 40 (L) > OR = 50 mg/dL   Triglycerides 52 <150 mg/dL   LDL Cholesterol (Calc) 94 mg/dL (calc)    Comment: Reference range: <100 . Desirable range <100 mg/dL for primary prevention;   <70 mg/dL for patients with CHD or diabetic patients   with > or = 2 CHD risk factors. . Marland KitchenDL-C is now calculated using the Martin-Hopkins  calculation, which is a validated novel method providing  better accuracy than the Friedewald equation in the  estimation of LDL-C.  MaCresenciano Genret al. JAAnnamaria [id number];196(22 [id number]  (http://education.[facility].com/faq/FAQ164)    Total CHOL/HDL Ratio 3.7 <5.0 (calc)   Non-HDL Cholesterol (Calc) 107 <130 mg/dL (calc)    Comment: For patients with diabetes plus 1 major ASCVD risk  factor, treating to a non-HDL-C goal of <100 mg/dL  (LDL-C of <70 mg/dL) is considered a therapeutic  option.   Sedimentation rate     Status: None   Collection Time: 10/07/18 12:00 AM  Result Value Ref Range   Sed Rate 9 0 - 20 mm/h  C-reactive protein     Status: None   Collection Time: 10/07/18 12:00 AM  Result Value Ref Range   CRP 1.2 <8.0 mg/L     Fall Risk: Fall Risk  11/08/2018 01/11/2018 11/02/2017 04/29/2017 07/28/2016  Falls in the past year? 0 0 No No No  Number falls in past yr: 0 0 - - -  Injury with Fall? 0 0 - - -    Functional Status  Survey: Is the patient deaf or have difficulty hearing?: No Does the patient have difficulty seeing, even when wearing glasses/contacts?: No Does the patient have difficulty concentrating, remembering, or making decisions?: No Does the patient have difficulty walking or climbing stairs?: No Does the patient have difficulty dressing or bathing?: No Does the patient have difficulty doing errands alone such as visiting a doctor's office or shopping?: No   Assessment & Plan  1. Well adult exam  - Lipid panel - COMPLETE METABOLIC PANEL WITH GFR - CBC with Differential/Platelet - Hemoglobin A1c - Vitamin B12 - VITAMIN D 25 Hydroxy (Vit-D Deficiency, Fractures) - Cytology - PAP  She refused the flu vaccine today , also discussed MMR booster  2. Cervical cancer screening  - Cytology - PAP  3. Vitamin D deficiency  - VITAMIN D 25 Hydroxy (Vit-D Deficiency,  Fractures)  4. Dyslipidemia  - Lipid panel  5. Insulin resistance  - COMPLETE METABOLIC PANEL WITH GFR - Hemoglobin A1c  6. Diabetes mellitus screening  - COMPLETE METABOLIC PANEL WITH GFR  7. Low serum vitamin B12  - CBC with Differential/Platelet - Vitamin B12   8. Needs flu shot   refused  9. Enlarged uterus  - US Pelvis Complete; Future  10. Breast lump on left side at 11 o'clock position  Stable  -USPSTF grade A and B recommendations reviewed with patient; age-appropriate recommendations, preventive care, screening tests, etc discussed and encouraged; healthy living encouraged; see AVS for patient education given to patient -Discussed importance of 150 minutes of physical activity weekly, eat two servings of fish weekly, eat one serving of tree nuts ( cashews, pistachios, pecans, almonds.Marland Kitchen) every other day, eat 6 servings of fruit/vegetables daily and drink plenty of water and avoid sweet beverages.

## 2018-11-09 LAB — CBC WITH DIFFERENTIAL/PLATELET
Absolute Monocytes: 321 cells/uL (ref 200–950)
Basophils Absolute: 10 cells/uL (ref 0–200)
Basophils Relative: 0.2 %
Eosinophils Absolute: 82 cells/uL (ref 15–500)
Eosinophils Relative: 1.6 %
HCT: 37.1 % (ref 35.0–45.0)
Hemoglobin: 12 g/dL (ref 11.7–15.5)
Lymphs Abs: 2412 cells/uL (ref 850–3900)
MCH: 29.1 pg (ref 27.0–33.0)
MCHC: 32.3 g/dL (ref 32.0–36.0)
MCV: 90 fL (ref 80.0–100.0)
MPV: 12 fL (ref 7.5–12.5)
Monocytes Relative: 6.3 %
Neutro Abs: 2275 cells/uL (ref 1500–7800)
Neutrophils Relative %: 44.6 %
Platelets: 189 10*3/uL (ref 140–400)
RBC: 4.12 10*6/uL (ref 3.80–5.10)
RDW: 12 % (ref 11.0–15.0)
Total Lymphocyte: 47.3 %
WBC: 5.1 10*3/uL (ref 3.8–10.8)

## 2018-11-09 LAB — COMPLETE METABOLIC PANEL WITH GFR
AG Ratio: 1.7 (calc) (ref 1.0–2.5)
ALT: 20 U/L (ref 6–29)
AST: 22 U/L (ref 10–30)
Albumin: 4.3 g/dL (ref 3.6–5.1)
Alkaline phosphatase (APISO): 55 U/L (ref 31–125)
BUN: 22 mg/dL (ref 7–25)
CO2: 29 mmol/L (ref 20–32)
Calcium: 9.5 mg/dL (ref 8.6–10.2)
Chloride: 105 mmol/L (ref 98–110)
Creat: 1.04 mg/dL (ref 0.50–1.10)
GFR, Est African American: 82 mL/min/{1.73_m2} (ref >=60)
GFR, Est Non African American: 71 mL/min/{1.73_m2} (ref >=60)
Globulin: 2.5 g/dL (calc) (ref 1.9–3.7)
Glucose, Bld: 86 mg/dL (ref 65–99)
Potassium: 4.6 mmol/L (ref 3.5–5.3)
Sodium: 138 mmol/L (ref 135–146)
Total Bilirubin: 0.5 mg/dL (ref 0.2–1.2)
Total Protein: 6.8 g/dL (ref 6.1–8.1)

## 2018-11-09 LAB — LIPID PANEL
Cholesterol: 149 mg/dL (ref <200)
HDL: 46 mg/dL — ABNORMAL LOW (ref >=50)
LDL Cholesterol (Calc): 87 mg/dL (calc)
Non-HDL Cholesterol (Calc): 103 mg/dL (calc) (ref <130)
Total CHOL/HDL Ratio: 3.2 (calc) (ref <5.0)
Triglycerides: 69 mg/dL (ref <150)

## 2018-11-09 LAB — HEMOGLOBIN A1C
Hgb A1c MFr Bld: 5 % of total Hgb (ref <5.7)
Mean Plasma Glucose: 97 (calc)
eAG (mmol/L): 5.4 (calc)

## 2018-11-09 LAB — VITAMIN D 25 HYDROXY (VIT D DEFICIENCY, FRACTURES): Vit D, 25-Hydroxy: 26 ng/mL — ABNORMAL LOW (ref 30–100)

## 2018-11-09 LAB — VITAMIN B12: Vitamin B-12: 336 pg/mL (ref 200–1100)

## 2018-11-15 LAB — CYTOLOGY - PAP
Chlamydia: NEGATIVE
Comment: NEGATIVE
Comment: NEGATIVE
Comment: NORMAL
Diagnosis: NEGATIVE
High risk HPV: NEGATIVE
Neisseria Gonorrhea: NEGATIVE

## 2018-12-26 ENCOUNTER — Encounter: Payer: Self-pay | Admitting: Family Medicine

## 2018-12-26 DIAGNOSIS — E538 Deficiency of other specified B group vitamins: Secondary | ICD-10-CM | POA: Insufficient documentation

## 2018-12-29 IMAGING — CR DG CHEST 2V
1 series · 2 of 2 positions shown · non-contrast
Comparison: None.

CLINICAL DATA: Productive cough.  Shortness of breath.

EXAM:
CHEST - 2 VIEW

[Series 1: dg chest 2 view · 0.14mm/px · 2 of 2 slices shown]
[im 1/2]
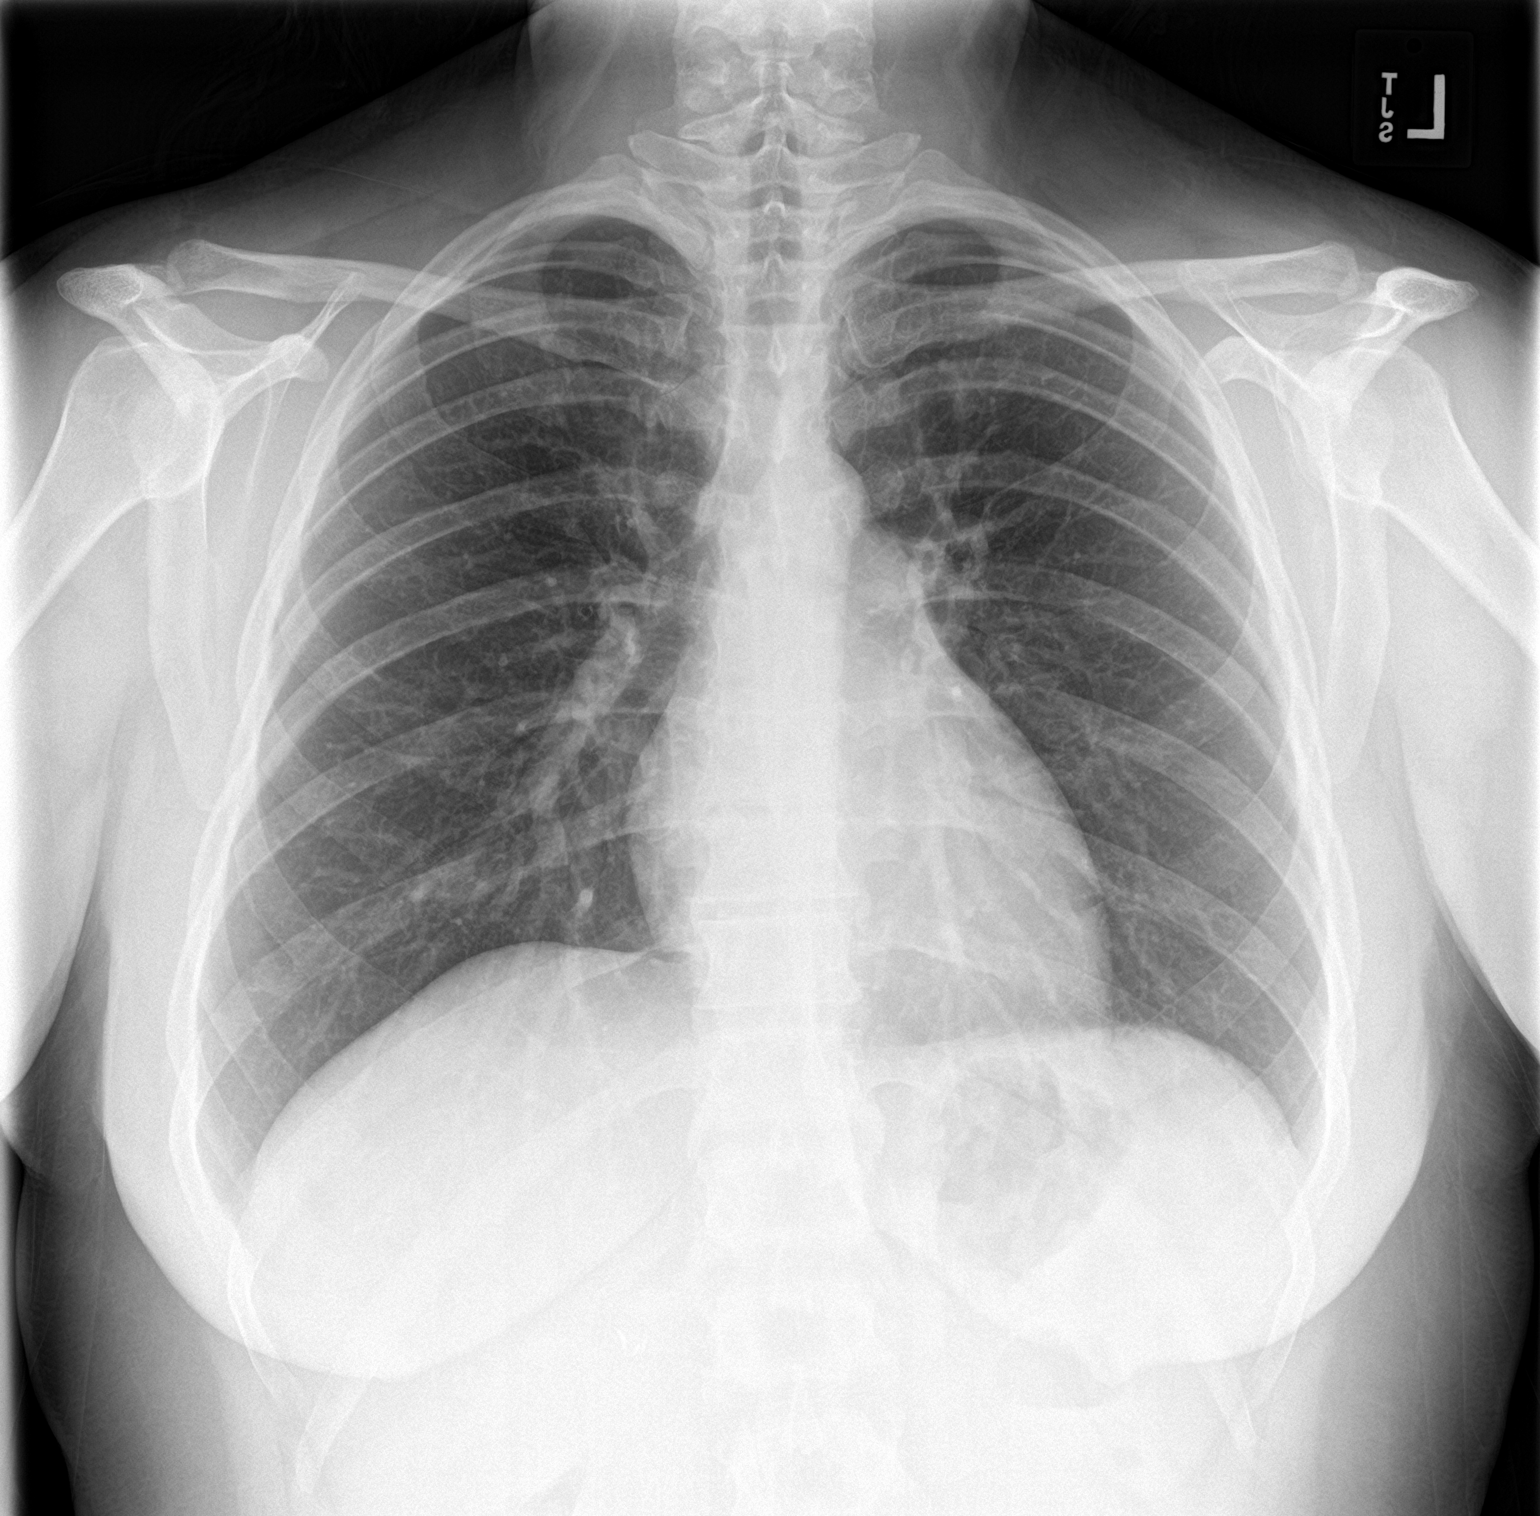
[im 2/2]
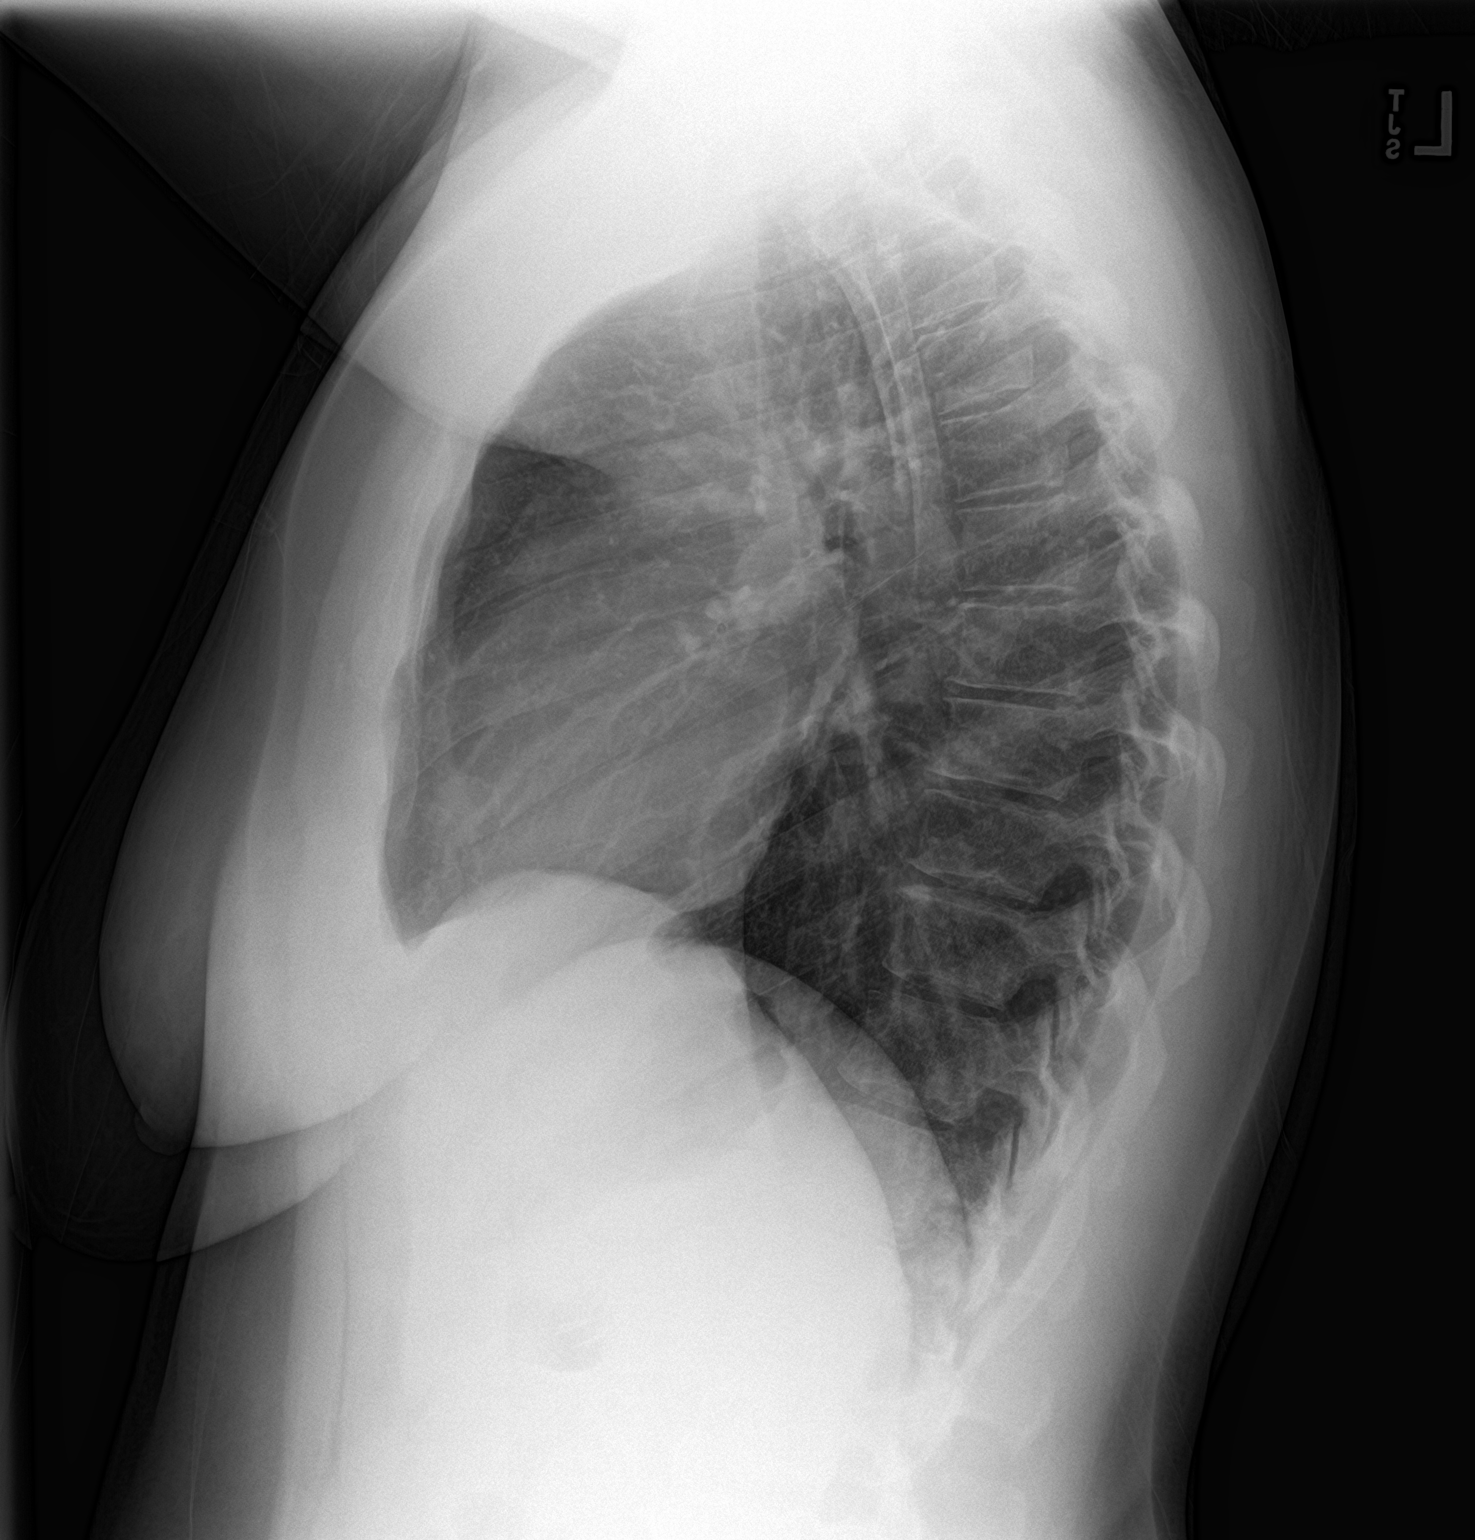

[2 of 2 positions shown; findings below may reference images not displayed]

FINDINGS: The heart size and mediastinal contours are within normal limits.
Both lungs are clear. The visualized skeletal structures are
unremarkable.
IMPRESSION: No active cardiopulmonary disease.

## 2019-06-07 DIAGNOSIS — D485 Neoplasm of uncertain behavior of skin: Secondary | ICD-10-CM | POA: Diagnosis not present

## 2019-06-07 DIAGNOSIS — Z86018 Personal history of other benign neoplasm: Secondary | ICD-10-CM | POA: Diagnosis not present

## 2019-06-07 DIAGNOSIS — L578 Other skin changes due to chronic exposure to nonionizing radiation: Secondary | ICD-10-CM | POA: Diagnosis not present

## 2019-07-05 DIAGNOSIS — D485 Neoplasm of uncertain behavior of skin: Secondary | ICD-10-CM | POA: Diagnosis not present

## 2019-11-08 NOTE — Progress Notes (Signed)
Name: Andrea Jordan   MRN: [id number]    DOB: Sep 12, 1985   Date:11/09/2019       Progress Note  Subjective  Chief Complaint  CPE  HPI  Patient presents for annual CPE.  Dyslipidemia: much healthier diet, lost 115 lbs in the past 2 years by life style modification. We will recheck labs  Obesity: BMI is above 30 but used to be over 45, she is physically active, meal prepping and doing great  Fatty liver: occasionally has some right upper quadrant pain, s/p cholecystectomy, no nausea or vomiting, we will recheck levels.   Vitamin D and B12 deficiency: she takes MVI, we will recheck levels  Diet: very balanced diet, still under a nutrition program  Exercise: she is in a exercise program     Office Visit from 11/09/2019 in [facility]  AUDIT-C Score 0     Depression: Phq 9 is  negative Depression screen [facility] 2/9 11/09/2019 11/08/2018 01/11/2018 11/02/2017 04/29/2017  Decreased Interest 0 0 0 0 0  Down, Depressed, Hopeless 0 0 0 0 0  PHQ - 2 Score 0 0 0 0 0  Altered sleeping 0 0 0 0 0  Tired, decreased energy 0 0 0 0 1  Change in appetite 0 0 0 0 1  Feeling bad or failure about yourself  0 0 0 0 0  Trouble concentrating 0 0 0 0 0  Moving slowly or fidgety/restless 0 0 0 0 0  Suicidal thoughts 0 0 0 0 0  PHQ-9 Score 0 0 0 0 2  Difficult doing work/chores - Not difficult at all Not difficult at all Not difficult at all Not difficult at all   Hypertension: BP Readings from Last 3 Encounters:  11/09/19 108/70  11/08/18 100/70  01/11/18 110/72   Obesity: Wt Readings from Last 3 Encounters:  11/09/19 179 lb 9.6 oz (81.5 kg)  11/08/18 197 lb 3.2 oz (89.4 kg)  01/11/18 227 lb 9.6 oz (103.2 kg)   BMI Readings from Last 3 Encounters:  11/09/19 31.81 kg/m  11/08/18 34.93 kg/m  01/11/18 36.74 kg/m     Vaccines:  HPV: up to at age 59 , ask insurance if age between 50-45 - patient refused Shingrix: 82-64 yo and ask insurance if covered when patient  above 68 yo Pneumonia:  educated and discussed with patient. Flu:  educated and discussed with patient.  Hep C Screening: 2019  STD testing and prevention (HIV/chl/gon/syphilis): not interested  Intimate partner violence: negative screen  Sexual History : no pain  Menstrual History/LMP/Abnormal Bleeding: cycles are regular , IUD - she will contact GYN to see if it is time for replacement  Incontinence Symptoms: mild when doing jumping jacks   Breast cancer:  - Last Mammogram: N/A - BRCA gene screening: N/A   Osteoporosis: Discussed high calcium and vitamin D supplementation, weight bearing exercises  Cervical cancer screening: 11/08/2018  Skin cancer: Discussed monitoring for atypical lesions  Colorectal cancer: she states father and sister have polyps, sister told her to have colonoscopy at age 11, advised to contact GI and find out if now is a good time  ECG: N/A  Advanced Care Planning: A voluntary discussion about advance care planning including the explanation and discussion of advance directives.  Discussed health care proxy and Living will, and the patient was able to identify a health care proxy as father .  Patient does not  have a living will at present time. If patient does have living will,  I have requested they bring this to the clinic to be scanned in to their chart.  Lipids: Lab Results  Component Value Date   CHOL 149 11/08/2018   CHOL 147 10/07/2018   CHOL 152 11/02/2017   Lab Results  Component Value Date   HDL 46 (L) 11/08/2018   HDL 40 (L) 10/07/2018   HDL 36 (L) 11/02/2017   Lab Results  Component Value Date   LDLCALC 87 11/08/2018   LDLCALC 94 10/07/2018   LDLCALC 101 (H) 11/02/2017   Lab Results  Component Value Date   TRIG 69 11/08/2018   TRIG 52 10/07/2018   TRIG 63 11/02/2017   Lab Results  Component Value Date   CHOLHDL 3.2 11/08/2018   CHOLHDL 3.7 10/07/2018   CHOLHDL 4.2 11/02/2017   No results found for:  LDLDIRECT  Glucose: Glucose  Date Value Ref Range Status  10/26/2011 119 (H) 65 - 99 mg/dL Final   Glucose, Bld  Date Value Ref Range Status  11/08/2018 86 65 - 99 mg/dL Final    Comment:    .            Fasting reference interval .   10/07/2018 84 65 - 99 mg/dL Final    Comment:    .            Fasting reference interval .   11/02/2017 85 65 - 99 mg/dL Final    Comment:    .            Fasting reference interval .     Patient Active Problem List   Diagnosis Date Noted  . B12 deficiency 12/26/2018  . Mass of upper outer quadrant of left breast 12/03/2016  . Vitamin D deficiency 07/28/2016  . Dyslipidemia 07/28/2016  . Insulin resistance 07/28/2016  . Numerous moles 07/28/2016  . Fatty liver 05/04/2016  . Obesity, Class III, BMI 40-49.9 (morbid obesity) ([place]) 04/20/2016  . PCOS (polycystic ovarian syndrome) 04/20/2016    Past Surgical History:  Procedure Laterality Date  . CHOLECYSTECTOMY, LAPAROSCOPIC  11/18/2011   DR. BYRD  . COLPOSCOPY  2010   CIN I  . DILATION AND CURETTAGE OF UTERUS  10/04/2008  . LEEP  10/04/2008   cervical; PJR    Family History  Problem Relation Age of Onset  . Brain cancer Mother        hodgkins lymphpoma  . Colon polyps Father   . Diabetes Father   . Heart disease Father   . Hypertension Father   . Melanoma Father   . Colon polyps Sister   . Endometriosis Sister   . Diabetes Paternal Uncle   . Heart disease Paternal Uncle   . Stroke Paternal Uncle   . Diabetes Maternal Grandmother   . Diabetes Paternal Grandmother   . Diabetes Paternal Grandfather   . Breast cancer Neg Hx     Social History   Socioeconomic History  . Marital status: Married    Spouse name: Corene Cornea  . Number of children: 2  . Years of education: 58yr college  . Highest education level: Not on file  Occupational History  . Occupation: Estate agent    Comment: Insurance underwriter   Tobacco Use  . Smoking status: Never Smoker  . Smokeless  tobacco: Never Used  Vaping Use  . Vaping Use: Never used  Substance and Sexual Activity  . Alcohol use: Yes    Comment: rarely  . Drug use: No  . Sexual activity: Yes  Partners: Male    Birth control/protection: None, I.U.D.    Comment: Liletta  Other Topics Concern  . Not on file  Social History Narrative   Married, husband had a stroke at age 49 but is back to normal   They have two girls.   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 Difficulty of Paying Living Expenses: Not hard at all  Food Insecurity: No Food Insecurity  . Worried About Programme researcher, broadcasting/film/video in the Last Year: Never true  . Ran Out of Food in the Last Year: Never true  Transportation Needs: No Transportation Needs  . Lack of Transportation (Medical): No  . Lack of Transportation (Non-Medical): No  Physical Activity: Sufficiently Active  . Days of Exercise per Week: 6 days  . Minutes of Exercise per Session: 60 min  Stress: No Stress Concern Present  . Feeling of Stress : Only a little  Social Connections: Socially Integrated  . Frequency of Communication with Friends and Family: More than three times a week  . Frequency of Social Gatherings with Friends and Family: Once a week  . Attends Religious Services: More than 4 times per year  . Active Member of Clubs or Organizations: Yes  . Attends Banker Meetings: More than 4 times per year  . Marital Status: Married  Catering manager Violence: Not At Risk  . Fear of Current or Ex-Partner: No  . Emotionally Abused: No  . Physically Abused: No  . Sexually Abused: No     Current Outpatient Medications:  .  levonorgestrel (LILETTA, 52 MG,) 18.6 MCG/DAY IUD IUD, 1 each by Intrauterine route once. 10/2014, Disp: , Rfl:  .  Multiple Vitamins-Minerals (WOMENS ONE DAILY PO), Take by mouth., Disp: , Rfl:   Allergies  Allergen Reactions  . Clams [Shellfish Allergy] Hives      ROS  Constitutional: Negative for fever , positive for  weight change.  Respiratory: Negative for cough and shortness of breath.   Cardiovascular: Negative for chest pain or palpitations.  Gastrointestinal: Negative for abdominal pain, no bowel changes.  Musculoskeletal: Negative for gait problem or joint swelling.  Skin: Negative for rash.  Neurological: Negative for dizziness or headache.  No other specific complaints in a complete review of systems (except as listed in HPI above).  Objective  Vitals:   11/09/19 0818  BP: 108/70  Pulse: 71  Resp: 16  Temp: 97.6 F (36.4 C)  TempSrc: Oral  SpO2: 99%  Weight: 179 lb 9.6 oz (81.5 kg)  Height: 5\' 3"  (1.6 m)    Body mass index is 31.81 kg/m.  Physical Exam  Constitutional: Patient appears well-developed and well-nourished. No distress.  HENT: Head: Normocephalic and atraumatic. Ears: B TMs ok, no erythema or effusion; Nose: Nose normal. Mouth/Throat: Oropharynx is clear and moist. No oropharyngeal exudate.  Eyes: Conjunctivae and EOM are normal. Pupils are equal, round, and reactive to light. No scleral icterus.  Neck: Normal range of motion. Neck supple. No JVD present. No thyromegaly present.  Cardiovascular: Normal rate, regular rhythm and normal heart sounds.  No murmur heard. No BLE edema. Pulmonary/Chest: Effort normal and breath sounds normal. No respiratory distress. Abdominal: Soft. Bowel sounds are normal, no distension. There is no tenderness. no masses Breast: no lumps or masses, no nipple discharge or rashes FEMALE GENITALIA:  External genitalia normal External urethra normal Vaginal vault normal without discharge or lesions  Cervix normal without discharge or lesions Bimanual exam normal without masses RECTAL: no rectal masses or hemorrhoids Musculoskeletal: Normal range of motion, no joint effusions. No gross deformities Neurological: he is alert and oriented to person, place, and time. No cranial  nerve deficit. Coordination, balance, strength, speech and gait are normal.  Skin: Skin is warm and dry. No rash noted. No erythema.  Psychiatric: Patient has a normal mood and affect. behavior is normal. Judgment and thought content normal.  Fall Risk: Fall Risk  11/09/2019 11/08/2018 01/11/2018 11/02/2017 04/29/2017  Falls in the past year? 0 0 0 No No  Number falls in past yr: 0 0 0 - -  Injury with Fall? 0 0 0 - -     Functional Status Survey: Is the patient deaf or have difficulty hearing?: No Does the patient have difficulty seeing, even when wearing glasses/contacts?: No Does the patient have difficulty concentrating, remembering, or making decisions?: No Does the patient have difficulty walking or climbing stairs?: No Does the patient have difficulty dressing or bathing?: No Does the patient have difficulty doing errands alone such as visiting a doctor's office or shopping?: No   Assessment & Plan  1. Well adult exam  - CBC with Differential/Platelet - COMPLETE METABOLIC PANEL WITH GFR - Vitamin D (25 hydroxy) - Vitamin B12 - HgB A1c - Lipid Profile - TSH  2. Cervical cancer screening  - Cytology - PAP  3. Vitamin D deficiency  - Vitamin D (25 hydroxy)  4. Insulin resistance  - HgB A1c  5. Fatty liver  - COMPLETE METABOLIC PANEL WITH GFR  6. Dyslipidemia  - Lipid Profile  7. Low serum vitamin B12  - Vitamin B12  8. Family history of polyps in the colon   -USPSTF grade A and B recommendations reviewed with patient; age-appropriate recommendations, preventive care, screening tests, etc discussed and encouraged; healthy living encouraged; see AVS for patient education given to patient -Discussed importance of 150 minutes of physical activity weekly, eat two servings of fish weekly, eat one serving of tree nuts ( cashews, pistachios, pecans, almonds.Marland Kitchen) every other day, eat 6 servings of fruit/vegetables daily and drink plenty of water and avoid sweet  beverages.

## 2019-11-08 NOTE — Patient Instructions (Signed)
Preventive Care 21-35 Years Old, Female Preventive care refers to visits with your health care provider and lifestyle choices that can promote health and wellness. This includes:  A yearly physical exam. This may also be called an annual well check.  Regular dental visits and eye exams.  Immunizations.  Screening for certain conditions.  Healthy lifestyle choices, such as eating a healthy diet, getting regular exercise, not using drugs or products that contain nicotine and tobacco, and limiting alcohol use. What can I expect for my preventive care visit? Physical exam Your health care provider will check your:  Height and weight. This may be used to calculate body mass index (BMI), which tells if you are at a healthy weight.  Heart rate and blood pressure.  Skin for abnormal spots. Counseling Your health care provider may ask you questions about your:  Alcohol, tobacco, and drug use.  Emotional well-being.  Home and relationship well-being.  Sexual activity.  Eating habits.  Work and work environment.  Method of birth control.  Menstrual cycle.  Pregnancy history. What immunizations do I need?  Influenza (flu) vaccine  This is recommended every year. Tetanus, diphtheria, and pertussis (Tdap) vaccine  You may need a Td booster every 10 years. Varicella (chickenpox) vaccine  You may need this if you have not been vaccinated. Human papillomavirus (HPV) vaccine  If recommended by your health care provider, you may need three doses over 6 months. Measles, mumps, and rubella (MMR) vaccine  You may need at least one dose of MMR. You may also need a second dose. Meningococcal conjugate (MenACWY) vaccine  One dose is recommended if you are age 19-21 years and a first-year college student living in a residence hall, or if you have one of several medical conditions. You may also need additional booster doses. Pneumococcal conjugate (PCV13) vaccine  You may need  this if you have certain conditions and were not previously vaccinated. Pneumococcal polysaccharide (PPSV23) vaccine  You may need one or two doses if you smoke cigarettes or if you have certain conditions. Hepatitis A vaccine  You may need this if you have certain conditions or if you travel or work in places where you may be exposed to hepatitis A. Hepatitis B vaccine  You may need this if you have certain conditions or if you travel or work in places where you may be exposed to hepatitis B. Haemophilus influenzae type b (Hib) vaccine  You may need this if you have certain conditions. You may receive vaccines as individual doses or as more than one vaccine together in one shot (combination vaccines). Talk with your health care provider about the risks and benefits of combination vaccines. What tests do I need?  Blood tests  Lipid and cholesterol levels. These may be checked every 5 years starting at age 20.  Hepatitis C test.  Hepatitis B test. Screening  Diabetes screening. This is done by checking your blood sugar (glucose) after you have not eaten for a while (fasting).  Sexually transmitted disease (STD) testing.  BRCA-related cancer screening. This may be done if you have a family history of breast, ovarian, tubal, or peritoneal cancers.  Pelvic exam and Pap test. This may be done every 3 years starting at age 21. Starting at age 30, this may be done every 5 years if you have a Pap test in combination with an HPV test. Talk with your health care provider about your test results, treatment options, and if necessary, the need for more tests.   Follow these instructions at home: Eating and drinking   Eat a diet that includes fresh fruits and vegetables, whole grains, lean protein, and low-fat dairy.  Take vitamin and mineral supplements as recommended by your health care provider.  Do not drink alcohol if: ? Your health care provider tells you not to drink. ? You are  pregnant, may be pregnant, or are planning to become pregnant.  If you drink alcohol: ? Limit how much you have to 0-1 drink a day. ? Be aware of how much alcohol is in your drink. In the [place] one drink equals one 12 oz bottle of beer (355 mL), one 5 oz glass of wine (148 mL), or one 1 oz glass of hard liquor (44 mL). Lifestyle  Take daily care of your teeth and gums.  Stay active. Exercise for at least 30 minutes on 5 or more days each week.  Do not use any products that contain nicotine or tobacco, such as cigarettes, e-cigarettes, and chewing tobacco. If you need help quitting, ask your health care provider.  If you are sexually active, practice safe sex. Use a condom or other form of birth control (contraception) in order to prevent pregnancy and STIs (sexually transmitted infections). If you plan to become pregnant, see your health care provider for a preconception visit. What's next?  Visit your health care provider once a year for a well check visit.  Ask your health care provider how often you should have your eyes and teeth checked.  Stay up to date on all vaccines. This information is not intended to replace advice given to you by your health care provider. Make sure you discuss any questions you have with your health care provider. Document Revised: 09/23/2017 Document Reviewed: 09/23/2017 Elsevier Patient Education  2020 [employer].

## 2019-11-09 ENCOUNTER — Other Ambulatory Visit: Payer: Self-pay

## 2019-11-09 ENCOUNTER — Encounter: Payer: Self-pay | Admitting: Family Medicine

## 2019-11-09 ENCOUNTER — Ambulatory Visit (INDEPENDENT_AMBULATORY_CARE_PROVIDER_SITE_OTHER): Payer: BC Managed Care – PPO | Admitting: Family Medicine

## 2019-11-09 ENCOUNTER — Other Ambulatory Visit (HOSPITAL_COMMUNITY)
Admission: RE | Admit: 2019-11-09 | Discharge: 2019-11-09 | Disposition: A | Discharge status: Home / Self Care | Payer: BC Managed Care – PPO | Source: Ambulatory Visit | Attending: Family Medicine | Admitting: Family Medicine

## 2019-11-09 VITALS — BP 108/70 | HR 71 | Temp 97.6°F | Resp 16 | Ht 63.0 in | Wt 179.6 lb

## 2019-11-09 DIAGNOSIS — E538 Deficiency of other specified B group vitamins: Secondary | ICD-10-CM | POA: Diagnosis not present

## 2019-11-09 DIAGNOSIS — Z124 Encounter for screening for malignant neoplasm of cervix: Secondary | ICD-10-CM | POA: Diagnosis not present

## 2019-11-09 DIAGNOSIS — E559 Vitamin D deficiency, unspecified: Secondary | ICD-10-CM | POA: Diagnosis not present

## 2019-11-09 DIAGNOSIS — E785 Hyperlipidemia, unspecified: Secondary | ICD-10-CM | POA: Diagnosis not present

## 2019-11-09 DIAGNOSIS — E8881 Metabolic syndrome: Secondary | ICD-10-CM | POA: Diagnosis not present

## 2019-11-09 DIAGNOSIS — E88819 Insulin resistance, unspecified: Secondary | ICD-10-CM

## 2019-11-09 DIAGNOSIS — Z8371 Family history of colonic polyps: Secondary | ICD-10-CM

## 2019-11-09 DIAGNOSIS — Z0000 Encounter for general adult medical examination without abnormal findings: Secondary | ICD-10-CM

## 2019-11-09 DIAGNOSIS — K760 Fatty (change of) liver, not elsewhere classified: Secondary | ICD-10-CM

## 2019-11-09 DIAGNOSIS — Z83719 Family history of colon polyps, unspecified: Secondary | ICD-10-CM

## 2019-11-10 LAB — CYTOLOGY - PAP
Comment: NEGATIVE
Diagnosis: NEGATIVE
High risk HPV: NEGATIVE

## 2019-11-10 LAB — CBC WITH DIFFERENTIAL/PLATELET
Absolute Monocytes: 462 cells/uL (ref 200–950)
Basophils Absolute: 12 cells/uL (ref 0–200)
Basophils Relative: 0.2 %
Eosinophils Absolute: 30 cells/uL (ref 15–500)
Eosinophils Relative: 0.5 %
HCT: 39.9 % (ref 35.0–45.0)
Hemoglobin: 13.2 g/dL (ref 11.7–15.5)
Lymphs Abs: 2928 cells/uL (ref 850–3900)
MCH: 30.1 pg (ref 27.0–33.0)
MCHC: 33.1 g/dL (ref 32.0–36.0)
MCV: 90.9 fL (ref 80.0–100.0)
MPV: 11.4 fL (ref 7.5–12.5)
Monocytes Relative: 7.7 %
Neutro Abs: 2568 cells/uL (ref 1500–7800)
Neutrophils Relative %: 42.8 %
Platelets: 179 10*3/uL (ref 140–400)
RBC: 4.39 10*6/uL (ref 3.80–5.10)
RDW: 11.8 % (ref 11.0–15.0)
Total Lymphocyte: 48.8 %
WBC: 6 10*3/uL (ref 3.8–10.8)

## 2019-11-10 LAB — LIPID PANEL
Cholesterol: 156 mg/dL (ref <200)
HDL: 53 mg/dL (ref >=50)
LDL Cholesterol (Calc): 91 mg/dL (calc)
Non-HDL Cholesterol (Calc): 103 mg/dL (calc) (ref <130)
Total CHOL/HDL Ratio: 2.9 (calc) (ref <5.0)
Triglycerides: 41 mg/dL (ref <150)

## 2019-11-10 LAB — COMPLETE METABOLIC PANEL WITH GFR
AG Ratio: 1.7 (calc) (ref 1.0–2.5)
ALT: 15 U/L (ref 6–29)
AST: 16 U/L (ref 10–30)
Albumin: 4.3 g/dL (ref 3.6–5.1)
Alkaline phosphatase (APISO): 54 U/L (ref 31–125)
BUN: 22 mg/dL (ref 7–25)
CO2: 29 mmol/L (ref 20–32)
Calcium: 9.5 mg/dL (ref 8.6–10.2)
Chloride: 104 mmol/L (ref 98–110)
Creat: 0.95 mg/dL (ref 0.50–1.10)
GFR, Est African American: 91 mL/min/{1.73_m2} (ref >=60)
GFR, Est Non African American: 78 mL/min/{1.73_m2} (ref >=60)
Globulin: 2.5 g/dL (calc) (ref 1.9–3.7)
Glucose, Bld: 87 mg/dL (ref 65–139)
Potassium: 4.8 mmol/L (ref 3.5–5.3)
Sodium: 138 mmol/L (ref 135–146)
Total Bilirubin: 0.5 mg/dL (ref 0.2–1.2)
Total Protein: 6.8 g/dL (ref 6.1–8.1)

## 2019-11-10 LAB — TSH: TSH: 1.62 mIU/L

## 2019-11-10 LAB — VITAMIN B12: Vitamin B-12: 347 pg/mL (ref 200–1100)

## 2019-11-10 LAB — HEMOGLOBIN A1C
Hgb A1c MFr Bld: 5 % of total Hgb (ref <5.7)
Mean Plasma Glucose: 97 (calc)
eAG (mmol/L): 5.4 (calc)

## 2019-11-10 LAB — VITAMIN D 25 HYDROXY (VIT D DEFICIENCY, FRACTURES): Vit D, 25-Hydroxy: 33 ng/mL (ref 30–100)

## 2019-12-19 ENCOUNTER — Telehealth: Payer: Self-pay

## 2019-12-19 NOTE — Telephone Encounter (Signed)
Please resubmit office visit in October. I have updated pt insurance. Thank you

## 2019-12-19 NOTE — Telephone Encounter (Signed)
Sent message to [facility] Claim's Specialist to resubmit claim for the date of service 11/09/2019 with patient' updated insurance information.

## 2020-01-03 ENCOUNTER — Other Ambulatory Visit: Payer: Self-pay | Admitting: Family Medicine

## 2020-01-03 DIAGNOSIS — N852 Hypertrophy of uterus: Secondary | ICD-10-CM

## 2020-11-13 ENCOUNTER — Ambulatory Visit (INDEPENDENT_AMBULATORY_CARE_PROVIDER_SITE_OTHER): Payer: BC Managed Care – PPO | Admitting: Family Medicine

## 2020-11-13 ENCOUNTER — Encounter: Payer: Self-pay | Admitting: Family Medicine

## 2020-11-13 ENCOUNTER — Other Ambulatory Visit: Payer: Self-pay

## 2020-11-13 ENCOUNTER — Other Ambulatory Visit (HOSPITAL_COMMUNITY)
Admission: RE | Admit: 2020-11-13 | Discharge: 2020-11-13 | Disposition: A | Discharge status: Home / Self Care | Payer: BC Managed Care – PPO | Source: Ambulatory Visit | Attending: Family Medicine | Admitting: Family Medicine

## 2020-11-13 VITALS — BP 112/72 | HR 83 | Temp 97.8°F | Resp 16 | Ht 63.0 in | Wt 165.0 lb

## 2020-11-13 DIAGNOSIS — Z0000 Encounter for general adult medical examination without abnormal findings: Secondary | ICD-10-CM

## 2020-11-13 DIAGNOSIS — Z130 Encounter for screening for diseases of the blood and blood-forming organs and certain disorders involving the immune mechanism: Secondary | ICD-10-CM | POA: Diagnosis not present

## 2020-11-13 DIAGNOSIS — Z124 Encounter for screening for malignant neoplasm of cervix: Secondary | ICD-10-CM | POA: Diagnosis not present

## 2020-11-13 DIAGNOSIS — E785 Hyperlipidemia, unspecified: Secondary | ICD-10-CM

## 2020-11-13 DIAGNOSIS — E538 Deficiency of other specified B group vitamins: Secondary | ICD-10-CM | POA: Diagnosis not present

## 2020-11-13 NOTE — Progress Notes (Signed)
Name: Andrea Jordan   MRN: [id number]    DOB: April 11, 1985   Date:11/13/2020       Progress Note  Subjective  Chief Complaint  Annual Exam  HPI  Patient presents for annual CPE.  Diet: she cooks at home, prep meals on weekends, she is eating around 1700-1800 calories per day  Exercise: she is physically active , still has a Physicist, medical [facility]  AUDIT-C Score 0      Depression: Phq 9 is  negative Depression screen [facility] 2/9 11/13/2020 11/09/2019 11/08/2018 01/11/2018 11/02/2017  Decreased Interest 0 0 0 0 0  Down, Depressed, Hopeless 0 0 0 0 0  PHQ - 2 Score 0 0 0 0 0  Altered sleeping 0 0 0 0 0  Tired, decreased energy 0 0 0 0 0  Change in appetite 0 0 0 0 0  Feeling bad or failure about yourself  0 0 0 0 0  Trouble concentrating 0 0 0 0 0  Moving slowly or fidgety/restless 0 0 0 0 0  Suicidal thoughts 0 0 0 0 0  PHQ-9 Score 0 0 0 0 0  Difficult doing work/chores - - Not difficult at all Not difficult at all Not difficult at all   Hypertension: BP Readings from Last 3 Encounters:  11/13/20 112/72  11/09/19 108/70  11/08/18 100/70   Obesity: Wt Readings from Last 3 Encounters:  11/13/20 165 lb (74.8 kg)  11/09/19 179 lb 9.6 oz (81.5 kg)  11/08/18 197 lb 3.2 oz (89.4 kg)   BMI Readings from Last 3 Encounters:  11/13/20 29.23 kg/m  11/09/19 31.81 kg/m  11/08/18 34.93 kg/m     Vaccines:   COVID-19; refused Flu: educated and discussed with patient.  Hep C Screening: 11/02/17 STD testing and prevention (HIV/chl/gon/syphilis): 02/05/14 Intimate partner violence: negative screen  Sexual History : she has some discomfort the site of episiotomy -  Menstrual History/LMP/Abnormal Bleeding: IUD Incontinence Symptoms: no problems   Breast cancer:  - Last Mammogram: N/A - BRCA gene screening: N/A  Osteoporosis: Discussed high calcium and vitamin D supplementation, weight bearing  exercises  Cervical cancer screening: 11/09/19  Skin cancer: Discussed monitoring for atypical lesions  Colorectal cancer: N/A   Lung cancer: Low Dose CT Chest recommended if Age 68-80 years, 20 pack-year currently smoking OR have quit w/in 15years. Patient does not qualify.   ECG: N/A  Advanced Care Planning: A voluntary discussion about advance care planning including the explanation and discussion of advance directives.  Discussed health care proxy and Living will, and the patient was able to identify a health care proxy as husband   Lipids: Lab Results  Component Value Date   CHOL 156 11/09/2019   CHOL 149 11/08/2018   CHOL 147 10/07/2018   Lab Results  Component Value Date   HDL 53 11/09/2019   HDL 46 (L) 11/08/2018   HDL 40 (L) 10/07/2018   Lab Results  Component Value Date   LDLCALC 91 11/09/2019   LDLCALC 87 11/08/2018   LDLCALC 94 10/07/2018   Lab Results  Component Value Date   TRIG 41 11/09/2019   TRIG 69 11/08/2018   TRIG 52 10/07/2018   Lab Results  Component Value Date   CHOLHDL 2.9 11/09/2019   CHOLHDL 3.2 11/08/2018   CHOLHDL 3.7 10/07/2018   No results found for: LDLDIRECT  Glucose: Glucose  Date Value Ref Range Status  10/26/2011 119 (H) 65 -  99 mg/dL Final   Glucose, Bld  Date Value Ref Range Status  11/09/2019 87 65 - 139 mg/dL Final    Comment:    .        Non-fasting reference interval .   11/08/2018 86 65 - 99 mg/dL Final    Comment:    .            Fasting reference interval .   10/07/2018 84 65 - 99 mg/dL Final    Comment:    .            Fasting reference interval .     Patient Active Problem List   Diagnosis Date Noted   B12 deficiency 12/26/2018   Mass of upper outer quadrant of left breast 12/03/2016   Vitamin D deficiency 07/28/2016   Dyslipidemia 07/28/2016   Insulin resistance 07/28/2016   Numerous moles 07/28/2016   Fatty liver 05/04/2016   PCOS (polycystic ovarian syndrome) 04/20/2016    Past Surgical  History:  Procedure Laterality Date   CHOLECYSTECTOMY, LAPAROSCOPIC  11/18/2011   DR. BYRD   COLPOSCOPY  2010   CIN I   DILATION AND CURETTAGE OF UTERUS  10/04/2008   LEEP  10/04/2008   cervical; PJR    Family History  Problem Relation Age of Onset   Brain cancer Mother        hodgkins lymphpoma   Colon polyps Father    Diabetes Father    Heart disease Father    Hypertension Father    Melanoma Father    Colon polyps Sister    Endometriosis Sister    Diabetes Paternal Uncle    Heart disease Paternal Uncle    Stroke Paternal Uncle    Diabetes Maternal Grandmother    Diabetes Paternal Grandmother    Diabetes Paternal Grandfather    Breast cancer Neg Hx     Social History   Socioeconomic History   Marital status: Married    Spouse name: Barbara Cower   Number of children: 2   Years of education: 65yr college   Highest education level: Not on file  Occupational History   Occupation: Engineer, materials    Comment: Sports administrator   Tobacco Use   Smoking status: Never   Smokeless tobacco: Never  Vaping Use   Vaping Use: Never used  Substance and Sexual Activity   Alcohol use: Yes    Comment: rarely   Drug use: No   Sexual activity: Yes    Partners: Male    Birth control/protection: None, I.U.D.    Comment: Liletta  Other Topics Concern   Not on file  Social History Narrative   Married, husband had a stroke at age 80 but is back to normal   They have two girls.   Works as a Engineer, materials at [employer]: Low Risk    Difficulty of Paying Living Expenses: Not hard at all  Food Insecurity: No Food Insecurity   Worried About Programme researcher, broadcasting/film/video in the Last Year: Never true   Barista in the Last Year: Never true  Transportation Needs: No Transportation Needs   Lack of Transportation (Medical): No   Lack of Transportation (Non-Medical): No  Physical Activity: Sufficiently Active   Days of Exercise per  Week: 6 days   Minutes of Exercise per Session: 50 min  Stress: Stress Concern Present   Feeling of Stress : Rather much  Social Connections: Socially Integrated   Frequency of Communication with Friends and Family: Three times a week   Frequency of Social Gatherings with Friends and Family: More than three times a week   Attends Religious Services: 1 to 4 times per year   Active Member of [employer] or Organizations: Yes   Attends Archivist Meetings: 1 to 4 times per year   Marital Status: Married  Human resources officer Violence: Not At Risk   Fear of Current or Ex-Partner: No   Emotionally Abused: No   Physically Abused: No   Sexually Abused: No     Current Outpatient Medications:    levonorgestrel (LILETTA) 18.6 MCG/DAY IUD IUD, 1 each by Intrauterine route once. 10/2014, Disp: , Rfl:    Multiple Vitamins-Minerals (WOMENS ONE DAILY PO), Take by mouth., Disp: , Rfl:   Allergies  Allergen Reactions   Clams [Shellfish Allergy] Hives     ROS  Constitutional: Negative for fever, positive for  weight change.  Respiratory: Negative for cough and shortness of breath.   Cardiovascular: Negative for chest pain or palpitations.  Gastrointestinal: Negative for abdominal pain, no bowel changes.  Musculoskeletal: Negative for gait problem or joint swelling.  Skin: Negative for rash.  Neurological: Negative for dizziness or headache.  No other specific complaints in a complete review of systems (except as listed in HPI above).   Objective  Vitals:   11/13/20 0817  BP: 112/72  Pulse: 83  Resp: 16  Temp: 97.8 F (36.6 C)  SpO2: 98%  Weight: 165 lb (74.8 kg)  Height: $Remove'5\' 3"'gGwqFmW$  (1.6 m)    Body mass index is 29.23 kg/m.  Physical Exam  Constitutional: Patient appears well-developed and well-nourished. No distress.  HENT: Head: Normocephalic and atraumatic. Ears: B TMs ok, no erythema or effusion; Nose: Not done. Mouth/Throat: not done  Eyes: Conjunctivae and EOM are normal.  Pupils are equal, round, and reactive to light. No scleral icterus.  Neck: Normal range of motion. Neck supple. No JVD present. No thyromegaly present.  Cardiovascular: Normal rate, regular rhythm and normal heart sounds.  No murmur heard. No BLE edema. Pulmonary/Chest: Effort normal and breath sounds normal. No respiratory distress. Abdominal: Soft. Bowel sounds are normal, no distension. There is no tenderness. no masses Breast: no lumps or masses, no nipple discharge or rashes FEMALE GENITALIA:  External genitalia normal External urethra normal Vaginal vault normal without discharge or lesions Cervix normal without discharge or lesions Bimanual exam normal without masses RECTAL: not done  Musculoskeletal: Normal range of motion, no joint effusions. No gross deformities Neurological: he is alert and oriented to person, place, and time. No cranial nerve deficit. Coordination, balance, strength, speech and gait are normal.  Skin: Skin is warm and dry. No rash noted. No erythema.  Psychiatric: Patient has a normal mood and affect. behavior is normal. Judgment and thought content normal.   Fall Risk: Fall Risk  11/13/2020 11/09/2019 11/08/2018 01/11/2018 11/02/2017  Falls in the past year? 0 0 0 0 No  Number falls in past yr: 0 0 0 0 -  Injury with Fall? 0 0 0 0 -  Risk for fall due to : No Fall Risks - - - -  Follow up Falls prevention discussed - - - -     Functional Status Survey: Is the patient deaf or have difficulty hearing?: No Does the patient have difficulty seeing, even when wearing glasses/contacts?: No Does the patient have difficulty concentrating, remembering, or making decisions?: No Does the patient  have difficulty walking or climbing stairs?: No Does the patient have difficulty dressing or bathing?: No Does the patient have difficulty doing errands alone such as visiting a doctor's office or shopping?: No   Assessment & Plan  1. Well adult exam  - COMPLETE  METABOLIC PANEL WITH GFR  2. Cervical cancer screening  - Cytology - PAP  3. Low serum vitamin B12  - Vitamin B12  4. Dyslipidemia  - Lipid panel  5. Screening for deficiency anemia  - CBC with Differential/Platelet   -USPSTF grade A and B recommendations reviewed with patient; age-appropriate recommendations, preventive care, screening tests, etc discussed and encouraged; healthy living encouraged; see AVS for patient education given to patient -Discussed importance of 150 minutes of physical activity weekly, eat two servings of fish weekly, eat one serving of tree nuts ( cashews, pistachios, pecans, almonds.Marland Kitchen) every other day, eat 6 servings of fruit/vegetables daily and drink plenty of water and avoid sweet beverages.

## 2020-11-13 NOTE — Patient Instructions (Signed)
Preventive Care 21-36 Years Old, Female Preventive care refers to lifestyle choices and visits with your health care provider that can promote health and wellness. This includes: A yearly physical exam. This is also called an annual wellness visit. Regular dental and eye exams. Immunizations. Screening for certain conditions. Healthy lifestyle choices, such as: Eating a healthy diet. Getting regular exercise. Not using drugs or products that contain nicotine and tobacco. Limiting alcohol use. What can I expect for my preventive care visit? Physical exam Your health care provider may check your: Height and weight. These may be used to calculate your BMI (body mass index). BMI is a measurement that tells if you are at a healthy weight. Heart rate and blood pressure. Body temperature. Skin for abnormal spots. Counseling Your health care provider may ask you questions about your: Past medical problems. Family's medical history. Alcohol, tobacco, and drug use. Emotional well-being. Home life and relationship well-being. Sexual activity. Diet, exercise, and sleep habits. Work and work environment. Access to firearms. Method of birth control. Menstrual cycle. Pregnancy history. What immunizations do I need? Vaccines are usually given at various ages, according to a schedule. Your health care provider will recommend vaccines for you based on your age, medical history, and lifestyle or other factors, such as travel or where you work. What tests do I need? Blood tests Lipid and cholesterol levels. These may be checked every 5 years starting at age 20. Hepatitis C test. Hepatitis B test. Screening Diabetes screening. This is done by checking your blood sugar (glucose) after you have not eaten for a while (fasting). STD (sexually transmitted disease) testing, if you are at risk. BRCA-related cancer screening. This may be done if you have a family history of breast, ovarian, tubal, or  peritoneal cancers. Pelvic exam and Pap test. This may be done every 3 years starting at age 21. Starting at age 30, this may be done every 5 years if you have a Pap test in combination with an HPV test. Talk with your health care provider about your test results, treatment options, and if necessary, the need for more tests. Follow these instructions at home: Eating and drinking  Eat a healthy diet that includes fresh fruits and vegetables, whole grains, lean protein, and low-fat dairy products. Take vitamin and mineral supplements as recommended by your health care provider. Do not drink alcohol if: Your health care provider tells you not to drink. You are pregnant, may be pregnant, or are planning to become pregnant. If you drink alcohol: Limit how much you have to 0-1 drink a day. Be aware of how much alcohol is in your drink. In the [place] one drink equals one 12 oz bottle of beer (355 mL), one 5 oz glass of wine (148 mL), or one 1 oz glass of hard liquor (44 mL). Lifestyle Take daily care of your teeth and gums. Brush your teeth every morning and night with fluoride toothpaste. Floss one time each day. Stay active. Exercise for at least 30 minutes 5 or more days each week. Do not use any products that contain nicotine or tobacco, such as cigarettes, e-cigarettes, and chewing tobacco. If you need help quitting, ask your health care provider. Do not use drugs. If you are sexually active, practice safe sex. Use a condom or other form of protection to prevent STIs (sexually transmitted infections). If you do not wish to become pregnant, use a form of birth control. If you plan to become pregnant, see your health care provider   for a prepregnancy visit. Find healthy ways to cope with stress, such as: Meditation, yoga, or listening to music. Journaling. Talking to a trusted person. Spending time with friends and family. Safety Always wear your seat belt while driving or riding in a  vehicle. Do not drive: If you have been drinking alcohol. Do not ride with someone who has been drinking. When you are tired or distracted. While texting. Wear a helmet and other protective equipment during sports activities. If you have firearms in your house, make sure you follow all gun safety procedures. Seek help if you have been physically or sexually abused. What's next? Go to your health care provider once a year for an annual wellness visit. Ask your health care provider how often you should have your eyes and teeth checked. Stay up to date on all vaccines. This information is not intended to replace advice given to you by your health care provider. Make sure you discuss any questions you have with your health care provider. Document Revised: 03/22/2020 Document Reviewed: 09/23/2017 Elsevier Patient Education  2022 Elsevier Inc.  

## 2020-11-14 LAB — COMPLETE METABOLIC PANEL WITH GFR
AG Ratio: 1.8 (calc) (ref 1.0–2.5)
ALT: 19 U/L (ref 6–29)
AST: 19 U/L (ref 10–30)
Albumin: 4.4 g/dL (ref 3.6–5.1)
Alkaline phosphatase (APISO): 48 U/L (ref 31–125)
BUN/Creatinine Ratio: 24 (calc) — ABNORMAL HIGH (ref 6–22)
BUN: 24 mg/dL (ref 7–25)
CO2: 30 mmol/L (ref 20–32)
Calcium: 9.5 mg/dL (ref 8.6–10.2)
Chloride: 102 mmol/L (ref 98–110)
Creat: 1.02 mg/dL — ABNORMAL HIGH (ref 0.50–0.97)
Globulin: 2.4 g/dL (calc) (ref 1.9–3.7)
Glucose, Bld: 88 mg/dL (ref 65–99)
Potassium: 5.1 mmol/L (ref 3.5–5.3)
Sodium: 137 mmol/L (ref 135–146)
Total Bilirubin: 0.4 mg/dL (ref 0.2–1.2)
Total Protein: 6.8 g/dL (ref 6.1–8.1)
eGFR: 74 mL/min/{1.73_m2} (ref >=60)

## 2020-11-14 LAB — VITAMIN B12: Vitamin B-12: 526 pg/mL (ref 200–1100)

## 2020-11-14 LAB — CBC WITH DIFFERENTIAL/PLATELET
Absolute Monocytes: 398 cells/uL (ref 200–950)
Basophils Absolute: 11 cells/uL (ref 0–200)
Basophils Relative: 0.2 %
Eosinophils Absolute: 21 cells/uL (ref 15–500)
Eosinophils Relative: 0.4 %
HCT: 39 % (ref 35.0–45.0)
Hemoglobin: 12.8 g/dL (ref 11.7–15.5)
Lymphs Abs: 2109 cells/uL (ref 850–3900)
MCH: 30.3 pg (ref 27.0–33.0)
MCHC: 32.8 g/dL (ref 32.0–36.0)
MCV: 92.4 fL (ref 80.0–100.0)
MPV: 11.3 fL (ref 7.5–12.5)
Monocytes Relative: 7.5 %
Neutro Abs: 2761 cells/uL (ref 1500–7800)
Neutrophils Relative %: 52.1 %
Platelets: 203 10*3/uL (ref 140–400)
RBC: 4.22 10*6/uL (ref 3.80–5.10)
RDW: 11.9 % (ref 11.0–15.0)
Total Lymphocyte: 39.8 %
WBC: 5.3 10*3/uL (ref 3.8–10.8)

## 2020-11-14 LAB — LIPID PANEL
Cholesterol: 168 mg/dL (ref <200)
HDL: 60 mg/dL (ref >=50)
LDL Cholesterol (Calc): 94 mg/dL (calc)
Non-HDL Cholesterol (Calc): 108 mg/dL (calc) (ref <130)
Total CHOL/HDL Ratio: 2.8 (calc) (ref <5.0)
Triglycerides: 54 mg/dL (ref <150)

## 2020-11-15 LAB — CYTOLOGY - PAP
Comment: NEGATIVE
Diagnosis: NEGATIVE
High risk HPV: NEGATIVE

## 2021-07-02 ENCOUNTER — Ambulatory Visit: Payer: BC Managed Care – PPO | Admitting: Family Medicine

## 2021-07-02 ENCOUNTER — Encounter: Payer: Self-pay | Admitting: Family Medicine

## 2021-07-02 VITALS — BP 119/74 | HR 67 | Ht 63.0 in | Wt 176.0 lb

## 2021-07-02 DIAGNOSIS — Z30433 Encounter for removal and reinsertion of intrauterine contraceptive device: Secondary | ICD-10-CM | POA: Diagnosis not present

## 2021-07-02 DIAGNOSIS — Z3009 Encounter for other general counseling and advice on contraception: Secondary | ICD-10-CM | POA: Diagnosis not present

## 2021-07-02 MED ORDER — LEVONORGESTREL 20.1 MCG/DAY IU IUD
1.0000 | INTRAUTERINE_SYSTEM | Freq: Once | INTRAUTERINE | Status: AC
  Administered 2021-07-02: 1 via INTRAUTERINE

## 2021-07-02 NOTE — Progress Notes (Signed)
   Subjective:    Patient ID: Andrea Jordan is a 36 y.o. female presenting with Contraception (Removal and discuss other BC options )  on 07/02/2021  HPI: Has Liletta, in x 7 years. Ready for removal. Reports that she has some feelings of lows prior to cycle. She would like to consdier other options, which might have less hormones. She does not know what cycles are like, has h/o PCOS, but has lost > 100 lbs. Cycles are now regular and not heavy. Does not want NuvaRing or patch or OCPs (Can't remember). Does not want permanent sterilization, though she does not want any more children.  Review of Systems  Constitutional:  Negative for chills and fever.  Respiratory:  Negative for shortness of breath.   Cardiovascular:  Negative for chest pain.  Gastrointestinal:  Negative for abdominal pain, nausea and vomiting.  Genitourinary:  Negative for dysuria.  Skin:  Negative for rash.     Objective:    BP 119/74   Pulse 67   Ht '5\' 3"'$  (1.6 m)   Wt 176 lb (79.8 kg)   BMI 31.18 kg/m  Physical Exam Exam conducted with a chaperone present.  Constitutional:      General: She is not in acute distress.    Appearance: She is well-developed.  HENT:     Head: Normocephalic and atraumatic.  Eyes:     General: No scleral icterus. Cardiovascular:     Rate and Rhythm: Normal rate.  Pulmonary:     Effort: Pulmonary effort is normal.  Abdominal:     Palpations: Abdomen is soft.  Musculoskeletal:     Cervical back: Neck supple.  Skin:    General: Skin is warm and dry.  Neurological:     Mental Status: She is alert and oriented to person, place, and time.   Patient identified, informed consent performed, signed copy in chart, time out was performed Procedure: Speculum placed inside vagina.  Cervix visualized.  Strings grasped with ring forceps.  IUD removed intact.  Procedure: Cervix visualized.  Cleaned with Betadine x 2.  Grasped anteriourly with a single tooth tenaculum.  Uterus  sounded to 8 cm.  Liletta IUD placed per manufacturer's recommendations.  Strings trimmed to 3 cm.   Patient given post procedure instructions and Liletta care card with expiration date.  Patient is asked to check IUD strings periodically and follow up in 4-6 weeks for IUD check.    Assessment & Plan:  Encounter for removal and reinsertion of intrauterine contraceptive device (IUD)  Encounter for counseling regarding contraception - Declines alternatives to IUD. Discussed difference in hormone contraining and hormone free. Opts for hormone containing.  Return in about 4 weeks (around 07/30/2021) for iud check.  Donnamae Jude, MD 07/02/2021 9:31 AM

## 2021-07-02 NOTE — Progress Notes (Signed)
Would like to remove IUD and discuss other options, possibly the paragard

## 2021-08-06 ENCOUNTER — Ambulatory Visit: Payer: BC Managed Care – PPO | Admitting: Family Medicine

## 2021-08-07 ENCOUNTER — Other Ambulatory Visit: Payer: Self-pay

## 2021-08-07 NOTE — Progress Notes (Signed)
Pt completed pre-employment UDS, HR notified.

## 2021-11-14 NOTE — Patient Instructions (Signed)
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
## 2021-11-17 ENCOUNTER — Encounter: Payer: Self-pay | Admitting: Family Medicine

## 2021-11-17 ENCOUNTER — Ambulatory Visit (INDEPENDENT_AMBULATORY_CARE_PROVIDER_SITE_OTHER): Payer: BC Managed Care – PPO | Admitting: Family Medicine

## 2021-11-17 ENCOUNTER — Other Ambulatory Visit (HOSPITAL_COMMUNITY)
Admission: RE | Admit: 2021-11-17 | Discharge: 2021-11-17 | Disposition: A | Discharge status: Home / Self Care | Payer: BC Managed Care – PPO | Source: Ambulatory Visit | Attending: Family Medicine | Admitting: Family Medicine

## 2021-11-17 VITALS — BP 112/72 | HR 77 | Temp 98.5°F | Resp 16 | Ht 63.0 in | Wt 188.0 lb

## 2021-11-17 DIAGNOSIS — Z0000 Encounter for general adult medical examination without abnormal findings: Secondary | ICD-10-CM | POA: Insufficient documentation

## 2021-11-17 DIAGNOSIS — Z113 Encounter for screening for infections with a predominantly sexual mode of transmission: Secondary | ICD-10-CM | POA: Diagnosis not present

## 2021-11-17 DIAGNOSIS — E538 Deficiency of other specified B group vitamins: Secondary | ICD-10-CM

## 2021-11-17 DIAGNOSIS — Z131 Encounter for screening for diabetes mellitus: Secondary | ICD-10-CM | POA: Diagnosis not present

## 2021-11-17 DIAGNOSIS — Z124 Encounter for screening for malignant neoplasm of cervix: Secondary | ICD-10-CM

## 2021-11-17 DIAGNOSIS — Z130 Encounter for screening for diseases of the blood and blood-forming organs and certain disorders involving the immune mechanism: Secondary | ICD-10-CM

## 2021-11-17 DIAGNOSIS — E785 Hyperlipidemia, unspecified: Secondary | ICD-10-CM

## 2021-11-17 DIAGNOSIS — E559 Vitamin D deficiency, unspecified: Secondary | ICD-10-CM | POA: Diagnosis not present

## 2021-11-18 LAB — RPR: RPR Ser Ql: NONREACTIVE

## 2021-11-18 LAB — HEMOGLOBIN A1C
Hgb A1c MFr Bld: 5.2 % of total Hgb (ref <5.7)
Mean Plasma Glucose: 103 mg/dL
eAG (mmol/L): 5.7 mmol/L

## 2021-11-18 LAB — CBC WITH DIFFERENTIAL/PLATELET
Absolute Monocytes: 343 cells/uL (ref 200–950)
Basophils Absolute: 10 cells/uL (ref 0–200)
Basophils Relative: 0.2 %
Eosinophils Absolute: 29 cells/uL (ref 15–500)
Eosinophils Relative: 0.6 %
HCT: 36.4 % (ref 35.0–45.0)
Hemoglobin: 12.3 g/dL (ref 11.7–15.5)
Lymphs Abs: 1749 cells/uL (ref 850–3900)
MCH: 31.4 pg (ref 27.0–33.0)
MCHC: 33.8 g/dL (ref 32.0–36.0)
MCV: 92.9 fL (ref 80.0–100.0)
MPV: 12 fL (ref 7.5–12.5)
Monocytes Relative: 7 %
Neutro Abs: 2769 cells/uL (ref 1500–7800)
Neutrophils Relative %: 56.5 %
Platelets: 179 10*3/uL (ref 140–400)
RBC: 3.92 10*6/uL (ref 3.80–5.10)
RDW: 11.9 % (ref 11.0–15.0)
Total Lymphocyte: 35.7 %
WBC: 4.9 10*3/uL (ref 3.8–10.8)

## 2021-11-18 LAB — COMPLETE METABOLIC PANEL WITH GFR
AG Ratio: 1.8 (calc) (ref 1.0–2.5)
ALT: 16 U/L (ref 6–29)
AST: 18 U/L (ref 10–30)
Albumin: 4.3 g/dL (ref 3.6–5.1)
Alkaline phosphatase (APISO): 50 U/L (ref 31–125)
BUN: 21 mg/dL (ref 7–25)
CO2: 26 mmol/L (ref 20–32)
Calcium: 9.2 mg/dL (ref 8.6–10.2)
Chloride: 107 mmol/L (ref 98–110)
Creat: 0.87 mg/dL (ref 0.50–0.97)
Globulin: 2.4 g/dL (calc) (ref 1.9–3.7)
Glucose, Bld: 86 mg/dL (ref 65–99)
Potassium: 4.5 mmol/L (ref 3.5–5.3)
Sodium: 140 mmol/L (ref 135–146)
Total Bilirubin: 0.4 mg/dL (ref 0.2–1.2)
Total Protein: 6.7 g/dL (ref 6.1–8.1)
eGFR: 88 mL/min/{1.73_m2} (ref >=60)

## 2021-11-18 LAB — LIPID PANEL
Cholesterol: 144 mg/dL (ref <200)
HDL: 55 mg/dL (ref >=50)
LDL Cholesterol (Calc): 76 mg/dL (calc)
Non-HDL Cholesterol (Calc): 89 mg/dL (calc) (ref <130)
Total CHOL/HDL Ratio: 2.6 (calc) (ref <5.0)
Triglycerides: 51 mg/dL (ref <150)

## 2021-11-18 LAB — VITAMIN D 25 HYDROXY (VIT D DEFICIENCY, FRACTURES): Vit D, 25-Hydroxy: 26 ng/mL — ABNORMAL LOW (ref 30–100)

## 2021-11-18 LAB — VITAMIN B12: Vitamin B-12: 238 pg/mL (ref 200–1100)

## 2021-11-18 LAB — HIV ANTIBODY (ROUTINE TESTING W REFLEX): HIV 1&2 Ab, 4th Generation: NONREACTIVE

## 2021-11-19 LAB — CYTOLOGY - PAP
Chlamydia: NEGATIVE
Comment: NEGATIVE
Comment: NEGATIVE
Comment: NEGATIVE
Comment: NORMAL
Diagnosis: NEGATIVE
Diagnosis: REACTIVE
High risk HPV: POSITIVE — AB
Neisseria Gonorrhea: NEGATIVE
Trichomonas: NEGATIVE

## 2022-11-24 ENCOUNTER — Encounter: Payer: BC Managed Care – PPO | Admitting: Family Medicine

## 2022-12-22 ENCOUNTER — Encounter: Payer: BC Managed Care – PPO | Admitting: Family Medicine

## 2023-05-21 DIAGNOSIS — F411 Generalized anxiety disorder: Secondary | ICD-10-CM | POA: Diagnosis not present

## 2023-05-28 DIAGNOSIS — F411 Generalized anxiety disorder: Secondary | ICD-10-CM | POA: Diagnosis not present

## 2023-06-09 DIAGNOSIS — F331 Major depressive disorder, recurrent, moderate: Secondary | ICD-10-CM | POA: Diagnosis not present

## 2023-06-09 DIAGNOSIS — F411 Generalized anxiety disorder: Secondary | ICD-10-CM | POA: Diagnosis not present

## 2023-06-15 DIAGNOSIS — J029 Acute pharyngitis, unspecified: Secondary | ICD-10-CM | POA: Diagnosis not present

## 2023-06-22 DIAGNOSIS — F411 Generalized anxiety disorder: Secondary | ICD-10-CM | POA: Diagnosis not present

## 2023-07-09 DIAGNOSIS — F331 Major depressive disorder, recurrent, moderate: Secondary | ICD-10-CM | POA: Diagnosis not present

## 2023-07-09 DIAGNOSIS — F411 Generalized anxiety disorder: Secondary | ICD-10-CM | POA: Diagnosis not present

## 2023-07-23 DIAGNOSIS — F411 Generalized anxiety disorder: Secondary | ICD-10-CM | POA: Diagnosis not present
